# Patient Record
Sex: Female | Born: 1980 | Race: Asian | Hispanic: No | Marital: Married | Smoking: Never smoker
Health system: Southern US, Community
[De-identification: ages and names within clinical notes are randomized; demographics above are authoritative.]

## PROBLEM LIST (undated history)

## (undated) DIAGNOSIS — O169 Unspecified maternal hypertension, unspecified trimester: Secondary | ICD-10-CM

## (undated) DIAGNOSIS — E119 Type 2 diabetes mellitus without complications: Secondary | ICD-10-CM

## (undated) DIAGNOSIS — E039 Hypothyroidism, unspecified: Secondary | ICD-10-CM

## (undated) HISTORY — DX: Type 2 diabetes mellitus without complications: E11.9

---

## 2010-05-24 DIAGNOSIS — E119 Type 2 diabetes mellitus without complications: Secondary | ICD-10-CM

## 2010-05-24 HISTORY — DX: Type 2 diabetes mellitus without complications: E11.9

## 2012-11-17 ENCOUNTER — Ambulatory Visit (INDEPENDENT_AMBULATORY_CARE_PROVIDER_SITE_OTHER): Payer: 59 | Admitting: Gynecology

## 2012-11-17 ENCOUNTER — Encounter: Payer: Self-pay | Admitting: Gynecology

## 2012-11-17 VITALS — BP 100/60 | HR 78 | Resp 16 | Ht 62.0 in | Wt 169.0 lb

## 2012-11-17 DIAGNOSIS — N912 Amenorrhea, unspecified: Secondary | ICD-10-CM

## 2012-11-17 DIAGNOSIS — E119 Type 2 diabetes mellitus without complications: Secondary | ICD-10-CM

## 2012-11-17 LAB — HEMOGLOBIN A1C: Mean Plasma Glucose: 131 mg/dL — ABNORMAL HIGH (ref <117)

## 2012-11-17 MED ORDER — PROVIDA OB 20-20-1.25 MG PO CAPS: 1.0000 | ORAL_CAPSULE | Freq: Every day | ORAL | Status: DC

## 2012-11-17 MED ORDER — CONCEPT OB 130-92.4-1 MG PO CAPS: 1.0000 | ORAL_CAPSULE | Freq: Every day | ORAL | Status: DC

## 2012-11-17 MED ORDER — CONCEPT DHA 53.5-38-1 MG PO CAPS: 1.0000 | ORAL_CAPSULE | Freq: Every day | ORAL | Status: DC

## 2012-11-17 NOTE — Progress Notes (Signed)
Subjective:     Patient ID: Whitney Holloway, female   DOB: 1981/02/26, 32 y.o.   MRN: [id number]  HPI Comments: Pt here with husband to establish care, they recently moved from [place], menses due 5/5 but pt reports delay, thought might be related to stress of move.  Pt reports that she did not take pregnancy test.  Pt states menses are regular, they have been using condoms only for contraception.  Pt and husband informed re +UPT and are very happy. Pt denies any bleeding, cramping, only minimal nausea,no vomiting. She reports history of diabetes, had been on metformin fir 1.5y and recently changed to insulin.  Pt states FBS are 110+ and postprandial are 120's, she is on 70/30 and has been adjusting her dose recently.    Review of Systems  Constitutional: Negative for fever, activity change and appetite change.  Gastrointestinal: Negative for nausea, vomiting and diarrhea.  Genitourinary: Negative for vaginal bleeding, vaginal discharge, vaginal pain and pelvic pain.       Objective:   Physical Exam  Constitutional: She is oriented to person, place, and time. She appears well-developed and well-nourished.  Cardiovascular: Normal rate, regular rhythm and normal heart sounds.   Abdominal: Soft. She exhibits no distension and no mass. There is no tenderness. There is no rebound and no guarding.  Neurological: She is alert and oriented to person, place, and time.  Skin: Skin is warm and dry.  Pelvic exam:  VULVA: normal appearing vulva with no masses, tenderness or lesions,  VAGINA: normal appearing vagina with normal color and discharge, no lesions, CERVIX: normal appearing cervix without discharge or lesions, UTERUS: slightly enlarged, approx 10w, nontender  ADNEXA: normal adnexa in size, nontender and no masses.      Assessment:     Pregnancy diabetes     Plan:     Discussed importance of glycemic control in pregnancy Reviewed diet, activities PNV samples given Referral to OB ASAP  based on A2DM and gestational age, names of local practices given Pt has insulin, no rx needed at this time, suggest she chack FBS and PC's for new OB to adjust doses Questions addressed

## 2012-11-17 NOTE — Patient Instructions (Addendum)
Take a prenatal vitamen daily 1/2 unisom with vit B6 twice a day for nausea Avoid high order fish: tuna, mahi, sword, tile, grouper Keep blood sugar in control under control-fasting 70-90, 2h after meal less than 120 Exercise- heart rate below 140, no exercise flat on back

## 2012-11-19 ENCOUNTER — Encounter: Payer: Self-pay | Admitting: Gynecology

## 2012-11-20 ENCOUNTER — Telehealth: Payer: Self-pay | Admitting: *Deleted

## 2012-11-20 NOTE — Telephone Encounter (Signed)
Message copied by Lorraine Lax on Mon Nov 20, 2012  5:45 PM ------      Message from: Douglass Rivers      Created: Sat Nov 18, 2012  7:59 AM       Inform labs great, HBA1c a little high but not dangerous, she should try to get into OB soon due to her diabetes and gestational age ------

## 2012-11-20 NOTE — Telephone Encounter (Signed)
Tried to LM to call back no VM set up

## 2012-11-24 ENCOUNTER — Encounter: Payer: Self-pay | Admitting: Gynecology

## 2012-11-27 ENCOUNTER — Telehealth: Payer: Self-pay | Admitting: *Deleted

## 2012-11-27 NOTE — Telephone Encounter (Signed)
Erroneous Encounter

## 2012-11-27 NOTE — Telephone Encounter (Signed)
Pt notified (see labs)

## 2012-11-27 NOTE — Telephone Encounter (Signed)
Per Dr. Hyacinth Meeker needed to call [facility] to schedule pt for an appointment. I contacted [facility] and they said that the patient needs to call and schedule because they need to check her insurance benefits and make sure that it is financially okay with the patient. Gave them pt's insurance and told them that it was a Doctor Referral, they still need the patient to contact. I called the patient and notified her of this she said she would call them and set up a appointment and would call us with the date and time.  If patient does not call we will f/u.

## 2012-12-06 ENCOUNTER — Telehealth: Payer: Self-pay | Admitting: *Deleted

## 2012-12-06 NOTE — Telephone Encounter (Signed)
Called patient to f/u with her getting in with a OBGYN given her history of Diabetes. Patient said she is scheduled for a appointment with [facility] 12/19/12.

## 2012-12-19 LAB — OB RESULTS CONSOLE HGB/HCT, BLOOD: Hemoglobin: 12.5 g/dL

## 2012-12-19 LAB — OB RESULTS CONSOLE RUBELLA ANTIBODY, IGM: Rubella: IMMUNE

## 2012-12-19 LAB — OB RESULTS CONSOLE RPR: RPR: NONREACTIVE

## 2012-12-19 LAB — OB RESULTS CONSOLE HIV ANTIBODY (ROUTINE TESTING): HIV: NONREACTIVE

## 2012-12-19 LAB — OB RESULTS CONSOLE ANTIBODY SCREEN: Antibody Screen: NEGATIVE

## 2012-12-19 LAB — OB RESULTS CONSOLE HEPATITIS B SURFACE ANTIGEN: Hepatitis B Surface Ag: NEGATIVE

## 2012-12-20 ENCOUNTER — Encounter: Payer: Self-pay | Admitting: Gynecology

## 2012-12-20 ENCOUNTER — Other Ambulatory Visit: Payer: Self-pay | Admitting: Gynecology

## 2012-12-22 ENCOUNTER — Telehealth: Payer: Self-pay | Admitting: *Deleted

## 2012-12-22 MED ORDER — CONCEPT DHA 53.5-38-1 MG PO CAPS: 1.0000 | ORAL_CAPSULE | Freq: Every day | ORAL | Status: DC

## 2012-12-22 MED ORDER — CONCEPT OB 130-92.4-1 MG PO CAPS: 1.0000 | ORAL_CAPSULE | Freq: Every day | ORAL | Status: DC

## 2012-12-22 NOTE — Telephone Encounter (Signed)
Left Message To Call Back Re: Mychart request for OB Prenatal

## 2012-12-22 NOTE — Telephone Encounter (Signed)
S/w patient she had a appointment with [facility] on 12/19/12 pt says she saw a midwife, she doesn't see the actually physician until 12/27/12. She has taken all the samples Dr. Farrel Gobble has given her. Just needs some to last her until her appointment. Told patient we would refill them once until she see physician then she should get them filled by them. Pt is aware and agreeable.  Concept OB #30/0rf's Concept DHA #30/0rf's - Sent to [facility] (Pt is aware.)

## 2012-12-27 ENCOUNTER — Other Ambulatory Visit: Payer: Self-pay

## 2012-12-27 LAB — OB RESULTS CONSOLE GC/CHLAMYDIA
CHLAMYDIA, DNA PROBE: NEGATIVE
GC PROBE AMP, GENITAL: NEGATIVE

## 2012-12-28 ENCOUNTER — Other Ambulatory Visit (HOSPITAL_COMMUNITY): Payer: Self-pay | Admitting: Obstetrics & Gynecology

## 2012-12-28 DIAGNOSIS — O24119 Pre-existing diabetes mellitus, type 2, in pregnancy, unspecified trimester: Secondary | ICD-10-CM

## 2013-01-16 ENCOUNTER — Ambulatory Visit (HOSPITAL_COMMUNITY)
Admission: RE | Admit: 2013-01-16 | Discharge: 2013-01-16 | Disposition: A | Discharge status: Home / Self Care | Payer: 59 | Source: Ambulatory Visit | Attending: Obstetrics & Gynecology | Admitting: Obstetrics & Gynecology

## 2013-01-16 ENCOUNTER — Encounter (HOSPITAL_COMMUNITY): Payer: Self-pay

## 2013-01-16 DIAGNOSIS — E039 Hypothyroidism, unspecified: Secondary | ICD-10-CM | POA: Insufficient documentation

## 2013-01-16 DIAGNOSIS — O24119 Pre-existing diabetes mellitus, type 2, in pregnancy, unspecified trimester: Secondary | ICD-10-CM

## 2013-01-16 DIAGNOSIS — E119 Type 2 diabetes mellitus without complications: Secondary | ICD-10-CM

## 2013-01-16 DIAGNOSIS — E079 Disorder of thyroid, unspecified: Secondary | ICD-10-CM | POA: Insufficient documentation

## 2013-01-16 DIAGNOSIS — O358XX0 Maternal care for other (suspected) fetal abnormality and damage, not applicable or unspecified: Secondary | ICD-10-CM | POA: Insufficient documentation

## 2013-01-16 DIAGNOSIS — O24919 Unspecified diabetes mellitus in pregnancy, unspecified trimester: Secondary | ICD-10-CM | POA: Insufficient documentation

## 2013-01-16 NOTE — Progress Notes (Signed)
MATERNAL FETAL MEDICINE CONSULT  Patient Name: Whitney Holloway Medical Record Number:  [id number] Date of Birth: 09/27/1980 Requesting Physician Name:  Genia Del, MD Date of Service: 01/16/2013  Chief Complaint Insulin dependent diabetes in pregnancy  History of Present Illness Whitney Holloway was seen today secondary to insulin dependent diabetes in pregnancy at the request of Genia Del, MD.  The patient is a 32 y.o. G1P0000,at [redacted]w[redacted]d with an EDD of 06/28/2013, by Ultrasound dating method.  She came to the [place] approximately 3 months ago.  She was diagnosed with type II diabetes mellitus approximately 2 years ago and was managed with metformin until 6 months ago when she began insulin therapy.  She is currently taking 25 units of 70/30 insulin in the morning and 28 units in the evening.  Whitney Holloway did not bring her glucose log to her appointment today but reports that her fastings are below 95 and her 2 hour postprandial sugars are between 115-124.  She also has a history of hypothyroidism for which she takes thyroxine 30 mg daily.  She has had no other issues with this pregnancy.  She has an appointment with Endocrinology at the end of September to establish care.  She has had extensive diabetes education in [place].  Review of Systems Pertinent items are noted in HPI.  Patient History OB History  Gravida Para Term Preterm AB SAB TAB Ectopic Multiple Living  1 0 0 0 0 0 0 0 0 0     # Outcome Date GA Lbr Len/2nd Weight Sex Delivery Anes PTL Lv  1 CUR               Past Medical History  Diagnosis Date  . Diabetes 2012    insulin 65m-2014    No past surgical history on file.  History   Social History  . Marital Status: Married    Spouse Name: N/A    Number of Children: N/A  . Years of Education: N/A   Social History Main Topics  . Smoking status: Never Smoker   . Smokeless tobacco: Not on file  . Alcohol Use: No  . Drug Use: No  . Sexual Activity: Yes    Partners:  Male   Other Topics Concern  . Not on file   Social History Narrative  . No narrative on file    Family History  Problem Relation Age of Onset  . Diabetes Paternal Grandfather    Physical Examination Vitals - BP 134/88, pulse 106, Weight 173 lbs. General appearance - alert, well appearing, and in no distress  Assessment and Recommendations 1.  Diabetes mellitus in pregnancy.  Whitney Holloway has already met with a dietician and/or diabetes educator prior to arriving in the [place] to discuss dietary modification, blood glucose testing, and goals of diet therapy.  I reiterated the goals of therapy include fasting blood sugars less than 90 mg/dl and 2-hour postprandial less than 120 mg/dl with avoidance of hypoglycemia.  Although I do not have her glucose log to review today, from her report Whitney Holloway appears to be euglycemic on her current dose of 70/30.  She will likely need increasing dose of insulin to maintain euglycemia as the pregnancy progresses.  Ultimately, management of her insulin regimen will fall to her Endocrinologist, but if you feel dosage adjustments are required prior to her first visit there, we are happy to see her back to assist with medication adjustments.  Whitney Holloway reports she had an normal eye exam several months ago.  She should also have a baseline 24 hour urine collection to assess for occult renal disease.  Whitney Holloway will return in 2 weeks to complete her fetal anatomic survey.  Thereafter she will require serial growth ultrasounds every 4 weeks and twice weekly antepartum testing beginning at 32 weeks. 2.  Hypothyroidism.  Review of her outside records shows a recent TSH of 3.46, a Free T4 of 1.22, and a free T3 of 3.2 all of which are within normal limits.  Thus, I would continue her current thyroxine dose of 50 mg po daily.  I recommend check thyroid functions tests every trimester as increasing doses of thyroid replacement are usually required as pregnancy progresses.  No  additional ultrasounds or fetal surveillance is required above that already described above due to her diabetes.  Thank you for sending Whitney Holloway for this consult.  Please feel free to contact us if you have any questions.  I spent 30 minutes with Whitney Holloway today of which 50% was face-to-face counseling.  Rema Fendt, MD

## 2013-01-26 ENCOUNTER — Other Ambulatory Visit (HOSPITAL_COMMUNITY): Payer: Self-pay | Admitting: Obstetrics & Gynecology

## 2013-01-26 DIAGNOSIS — E119 Type 2 diabetes mellitus without complications: Secondary | ICD-10-CM

## 2013-01-26 DIAGNOSIS — O24919 Unspecified diabetes mellitus in pregnancy, unspecified trimester: Secondary | ICD-10-CM

## 2013-01-30 ENCOUNTER — Ambulatory Visit (HOSPITAL_COMMUNITY): Payer: 59

## 2013-01-30 ENCOUNTER — Ambulatory Visit (HOSPITAL_COMMUNITY)
Admission: RE | Admit: 2013-01-30 | Discharge: 2013-01-30 | Disposition: A | Discharge status: Home / Self Care | Payer: 59 | Source: Ambulatory Visit | Attending: Obstetrics & Gynecology | Admitting: Obstetrics & Gynecology

## 2013-01-30 DIAGNOSIS — Z3689 Encounter for other specified antenatal screening: Secondary | ICD-10-CM | POA: Insufficient documentation

## 2013-01-30 DIAGNOSIS — E079 Disorder of thyroid, unspecified: Secondary | ICD-10-CM | POA: Insufficient documentation

## 2013-01-30 DIAGNOSIS — O24919 Unspecified diabetes mellitus in pregnancy, unspecified trimester: Secondary | ICD-10-CM | POA: Insufficient documentation

## 2013-01-30 DIAGNOSIS — E119 Type 2 diabetes mellitus without complications: Secondary | ICD-10-CM

## 2013-01-30 DIAGNOSIS — O3410 Maternal care for benign tumor of corpus uteri, unspecified trimester: Secondary | ICD-10-CM | POA: Insufficient documentation

## 2013-01-30 DIAGNOSIS — E039 Hypothyroidism, unspecified: Secondary | ICD-10-CM | POA: Insufficient documentation

## 2013-01-30 DIAGNOSIS — D259 Leiomyoma of uterus, unspecified: Secondary | ICD-10-CM | POA: Insufficient documentation

## 2013-03-15 ENCOUNTER — Other Ambulatory Visit (HOSPITAL_COMMUNITY): Payer: Self-pay | Admitting: Obstetrics & Gynecology

## 2013-03-15 DIAGNOSIS — E119 Type 2 diabetes mellitus without complications: Secondary | ICD-10-CM

## 2013-03-16 ENCOUNTER — Ambulatory Visit (HOSPITAL_COMMUNITY)
Admission: RE | Admit: 2013-03-16 | Discharge: 2013-03-16 | Disposition: A | Discharge status: Home / Self Care | Payer: 59 | Source: Ambulatory Visit | Attending: Obstetrics & Gynecology | Admitting: Obstetrics & Gynecology

## 2013-03-16 VITALS — BP 146/95 | HR 120 | Wt 181.0 lb

## 2013-03-16 DIAGNOSIS — E079 Disorder of thyroid, unspecified: Secondary | ICD-10-CM | POA: Insufficient documentation

## 2013-03-16 DIAGNOSIS — Z3689 Encounter for other specified antenatal screening: Secondary | ICD-10-CM | POA: Insufficient documentation

## 2013-03-16 DIAGNOSIS — E119 Type 2 diabetes mellitus without complications: Secondary | ICD-10-CM

## 2013-03-16 DIAGNOSIS — O24919 Unspecified diabetes mellitus in pregnancy, unspecified trimester: Secondary | ICD-10-CM | POA: Insufficient documentation

## 2013-03-16 DIAGNOSIS — E039 Hypothyroidism, unspecified: Secondary | ICD-10-CM | POA: Insufficient documentation

## 2013-03-16 DIAGNOSIS — O3410 Maternal care for benign tumor of corpus uteri, unspecified trimester: Secondary | ICD-10-CM | POA: Insufficient documentation

## 2013-03-16 NOTE — Progress Notes (Signed)
Whitney Holloway  was seen today for an ultrasound appointment.  See full report in [facility].  Impression: Single IUP at 25 1/7 weeks Follow up due to pregestational diabetes, hypothyroidism Interval growth is appropriate (54th %tile) Normal interval anatomy Left uterine myoma as noted above.  Patient scheduled for fetal echo today  Recommendations: Recommend follow-up ultrasound examination in 4 weeks for interval growth.   Antepartum fetal testing to begin at 32 weeks.  Alpha Gula, MD

## 2013-03-29 ENCOUNTER — Other Ambulatory Visit: Payer: Self-pay

## 2013-04-16 LAB — OB RESULTS CONSOLE HGB/HCT, BLOOD: HEMATOCRIT: 38 %

## 2013-04-27 ENCOUNTER — Ambulatory Visit (HOSPITAL_COMMUNITY): Payer: 59

## 2013-05-30 ENCOUNTER — Encounter: Payer: Self-pay | Admitting: Gynecology

## 2013-06-10 ENCOUNTER — Inpatient Hospital Stay (HOSPITAL_COMMUNITY)
Admission: AD | Admit: 2013-06-10 | Discharge: 2013-06-15 | DRG: 765 | Disposition: A | Discharge status: Home / Self Care | Payer: 59 | Source: Ambulatory Visit | Attending: Obstetrics & Gynecology | Admitting: Obstetrics & Gynecology

## 2013-06-10 ENCOUNTER — Observation Stay (HOSPITAL_COMMUNITY): Payer: 59

## 2013-06-10 ENCOUNTER — Encounter (HOSPITAL_COMMUNITY): Payer: Self-pay | Admitting: *Deleted

## 2013-06-10 DIAGNOSIS — O9989 Other specified diseases and conditions complicating pregnancy, childbirth and the puerperium: Secondary | ICD-10-CM

## 2013-06-10 DIAGNOSIS — E119 Type 2 diabetes mellitus without complications: Secondary | ICD-10-CM | POA: Diagnosis present

## 2013-06-10 DIAGNOSIS — O139 Gestational [pregnancy-induced] hypertension without significant proteinuria, unspecified trimester: Secondary | ICD-10-CM | POA: Diagnosis present

## 2013-06-10 DIAGNOSIS — Z794 Long term (current) use of insulin: Secondary | ICD-10-CM

## 2013-06-10 DIAGNOSIS — Z22330 Carrier of Group B streptococcus: Secondary | ICD-10-CM

## 2013-06-10 DIAGNOSIS — O99892 Other specified diseases and conditions complicating childbirth: Secondary | ICD-10-CM | POA: Diagnosis present

## 2013-06-10 DIAGNOSIS — O368190 Decreased fetal movements, unspecified trimester, not applicable or unspecified: Secondary | ICD-10-CM | POA: Diagnosis present

## 2013-06-10 DIAGNOSIS — O409XX0 Polyhydramnios, unspecified trimester, not applicable or unspecified: Secondary | ICD-10-CM | POA: Diagnosis present

## 2013-06-10 DIAGNOSIS — E039 Hypothyroidism, unspecified: Secondary | ICD-10-CM | POA: Diagnosis present

## 2013-06-10 DIAGNOSIS — O169 Unspecified maternal hypertension, unspecified trimester: Secondary | ICD-10-CM

## 2013-06-10 DIAGNOSIS — O163 Unspecified maternal hypertension, third trimester: Secondary | ICD-10-CM

## 2013-06-10 DIAGNOSIS — O2432 Unspecified pre-existing diabetes mellitus in childbirth: Secondary | ICD-10-CM | POA: Diagnosis present

## 2013-06-10 DIAGNOSIS — Z9889 Other specified postprocedural states: Secondary | ICD-10-CM

## 2013-06-10 DIAGNOSIS — O99284 Endocrine, nutritional and metabolic diseases complicating childbirth: Secondary | ICD-10-CM

## 2013-06-10 DIAGNOSIS — O3660X0 Maternal care for excessive fetal growth, unspecified trimester, not applicable or unspecified: Secondary | ICD-10-CM | POA: Diagnosis present

## 2013-06-10 DIAGNOSIS — O321XX0 Maternal care for breech presentation, not applicable or unspecified: Principal | ICD-10-CM | POA: Diagnosis present

## 2013-06-10 DIAGNOSIS — O1400 Mild to moderate pre-eclampsia, unspecified trimester: Secondary | ICD-10-CM | POA: Diagnosis present

## 2013-06-10 DIAGNOSIS — E079 Disorder of thyroid, unspecified: Secondary | ICD-10-CM | POA: Diagnosis present

## 2013-06-10 HISTORY — DX: Hypothyroidism, unspecified: E03.9

## 2013-06-10 HISTORY — DX: Unspecified maternal hypertension, unspecified trimester: O16.9

## 2013-06-10 LAB — COMPREHENSIVE METABOLIC PANEL
ALK PHOS: 298 U/L — AB (ref 39–117)
ALT: 281 U/L — AB (ref 0–35)
ALT: 266 U/L — ABNORMAL HIGH (ref 0–35)
AST: 202 U/L — ABNORMAL HIGH (ref 0–37)
AST: 181 U/L — ABNORMAL HIGH (ref 0–37)
Albumin: 2.1 g/dL — ABNORMAL LOW (ref 3.5–5.2)
Albumin: 2 g/dL — ABNORMAL LOW (ref 3.5–5.2)
Alkaline Phosphatase: 294 U/L — ABNORMAL HIGH (ref 39–117)
BILIRUBIN TOTAL: 0.8 mg/dL (ref 0.3–1.2)
BUN: 13 mg/dL (ref 6–23)
BUN: 10 mg/dL (ref 6–23)
CO2: 21 mEq/L (ref 19–32)
CO2: 22 meq/L (ref 19–32)
CREATININE: 0.5 mg/dL (ref 0.50–1.10)
Calcium: 9.5 mg/dL (ref 8.4–10.5)
Calcium: 9.4 mg/dL (ref 8.4–10.5)
Chloride: 105 mEq/L (ref 96–112)
Chloride: 107 mEq/L (ref 96–112)
Creatinine, Ser: 0.56 mg/dL (ref 0.50–1.10)
GFR calc non Af Amer: >90 mL/min (ref >90)
GLUCOSE: 115 mg/dL — AB (ref 70–99)
Glucose, Bld: 63 mg/dL — ABNORMAL LOW (ref 70–99)
Potassium: 4.2 mEq/L (ref 3.7–5.3)
Potassium: 4.7 mEq/L (ref 3.7–5.3)
SODIUM: 139 meq/L (ref 137–147)
Sodium: 140 mEq/L (ref 137–147)
Total Bilirubin: 0.8 mg/dL (ref 0.3–1.2)
Total Protein: 6.2 g/dL (ref 6.0–8.3)
Total Protein: 6.1 g/dL (ref 6.0–8.3)

## 2013-06-10 LAB — GLUCOSE, CAPILLARY
GLUCOSE-CAPILLARY: 66 mg/dL — AB (ref 70–99)
GLUCOSE-CAPILLARY: 82 mg/dL (ref 70–99)
Glucose-Capillary: 82 mg/dL (ref 70–99)
Glucose-Capillary: 93 mg/dL (ref 70–99)

## 2013-06-10 LAB — PROTEIN / CREATININE RATIO, URINE
Creatinine, Urine: 63.49 mg/dL
Protein Creatinine Ratio: 0.57 — ABNORMAL HIGH (ref 0.00–0.15)
Total Protein, Urine: 36.2 mg/dL

## 2013-06-10 LAB — CBC
HEMATOCRIT: 35.7 % — AB (ref 36.0–46.0)
Hemoglobin: 11.9 g/dL — ABNORMAL LOW (ref 12.0–15.0)
MCH: 27 pg (ref 26.0–34.0)
MCHC: 33.3 g/dL (ref 30.0–36.0)
MCV: 81.1 fL (ref 78.0–100.0)
Platelets: 359 10*3/uL (ref 150–400)
RBC: 4.4 MIL/uL (ref 3.87–5.11)
RDW: 15.1 % (ref 11.5–15.5)
WBC: 7.7 10*3/uL (ref 4.0–10.5)

## 2013-06-10 LAB — HEPATITIS B SURFACE ANTIGEN: HEP B S AG: NEGATIVE

## 2013-06-10 LAB — HEPATITIS C ANTIBODY: HCV Ab: NEGATIVE

## 2013-06-10 LAB — URIC ACID: Uric Acid, Serum: 5 mg/dL (ref 2.4–7.0)

## 2013-06-10 MED ORDER — INSULIN NPH (HUMAN) (ISOPHANE) 100 UNIT/ML ~~LOC~~ SUSP
40.0000 [IU] | Freq: Every day | SUBCUTANEOUS | Status: DC
  Administered 2013-06-11: 40 [IU] via SUBCUTANEOUS

## 2013-06-10 MED ORDER — INSULIN NPH (HUMAN) (ISOPHANE) 100 UNIT/ML ~~LOC~~ SUSP
20.0000 [IU] | Freq: Once | SUBCUTANEOUS | Status: AC
  Administered 2013-06-10: 20 [IU] via SUBCUTANEOUS

## 2013-06-10 MED ORDER — INSULIN NPH (HUMAN) (ISOPHANE) 100 UNIT/ML ~~LOC~~ SUSP
42.0000 [IU] | Freq: Every day | SUBCUTANEOUS | Status: DC
  Administered 2013-06-10: 42 [IU] via SUBCUTANEOUS
  Filled 2013-06-10: qty 10

## 2013-06-10 MED ORDER — LEVOTHYROXINE SODIUM 100 MCG PO TABS
100.0000 ug | ORAL_TABLET | Freq: Every day | ORAL | Status: DC
  Administered 2013-06-10 – 2013-06-11 (×2): 100 ug via ORAL
  Filled 2013-06-10 (×3): qty 1

## 2013-06-10 NOTE — H&P (Signed)
  OB ADMISSION/ HISTORY & PHYSICAL:  Admission Date: 06/10/2013 12:33 AM  Admit Diagnosis: 37.[redacted] weeks gestation, Hypertension, Elevated liver enzymes  Whitney Holloway is a 33 y.o. female presenting for decreased fetal movement since 1000 am today. NST reactive. Hypertension noted after several BP's. Liver enzymes found to be elevated. Pt is admitted for observation and serial labs to rule out PEC. Denies HA, visual changes or epigastric pain. No nausea or vomiting. No CP or SOB. BS well controlled.  Prenatal History: G1P0000   EDC:06/28/2013, by Ultrasound   Prenatal care at [facility]  Primary Ob Provider: Dr. Dellis Filbert Prenatal course complicated by IDDM-preexisting, hypothyroidism, LGA, breech presentation, +GBS  Prenatal Labs: ABO, Rh: AB (06/27 1042) POS Antibody:  NEG Rubella:    RPR:   NR HBsAg:   NEG HIV:   NEG GBS:   POS 1 hr GTT : n/a  Medical / Surgical History :  Past medical history:  Past Medical History  Diagnosis Date  . Diabetes 2012    insulin 1m-2014  . Hypertension during pregnancy 06/10/2013     Past surgical history: History reviewed. No pertinent past surgical history.  Family History:  Family History  Problem Relation Age of Onset  . Diabetes Paternal Grandfather      Social History:  reports that she has never smoked. She does not have any smokeless tobacco history on file. She reports that she does not drink alcohol or use illicit drugs.  Allergies: Review of patient's allergies indicates no known allergies.   Current Medications at time of admission:  Prior to Admission medications   Medication Sig Start Date End Date Taking? Authorizing Provider  insulin NPH-regular (NOVOLIN 70/30) (70-30) 100 UNIT/ML injection Inject into the skin.   Yes Historical Provider, MD  levothyroxine (SYNTHROID, LEVOTHROID) 100 MCG tablet Take 100 mcg by mouth daily before breakfast.   Yes Historical Provider, MD  Prenat-FeFum-FePo-FA-Omega 3  (CONCEPT DHA) 53.5-38-1 MG CAPS Take 1 tablet by mouth daily. 12/22/12  Yes Azalia Bilis, MD  levothyroxine (SYNTHROID, LEVOTHROID) 75 MCG tablet Take 75 mcg by mouth daily before breakfast.    Historical Provider, MD  Prenat w/o A Vit-FeFum-FePo-FA (CONCEPT OB) 130-92.4-1 MG CAPS Take 1 tablet by mouth daily. 12/22/12   Azalia Bilis, MD  Prenat w/o A Vit-FeFum-FePo-FA (PROVIDA OB) 20-20-1.25 MG CAPS Take 1 tablet by mouth daily. 11/17/12   Azalia Bilis, MD     Review of Systems: Decreased FM No ctx No LOF No VB No HA, visual changes, or epigastric pain  Physical Exam:  VS: Blood pressure 129/84, pulse 83, temperature 98.1 F (36.7 C), resp. rate 20, height 5\' 2"  (1.575 m), weight 86.456 kg (190 lb 9.6 oz), last menstrual period 08/25/2012.  General: alert and oriented, appears comfortable\ HEENT : nCAT Neck: supple with FROM Heart: RRR Lungs: Clear lung fields No CVAT Abdomen: Gravid, soft and non-tender, non-distended / uterus: gravid, non-tender, No RUQ tenderness Extremities: no edema, DTR's 2+, no clonus, Homan's negative Neuro: non focal Skin: untact Genitalia / VE:  deferred  FHR: baseline rate 150 / variability mod / accelerations present / no decelerations TOCO: irregular, mild  Assessment: 37.[redacted] weeks gestation Decreased FM with Reactive NST Pre-gestational IDDM Hypothyroidism New onset mild HTN with elevated Transaminases of ? Etx. No s/s PEC  Plan:  Clear Liquids Admit for observation. Continuos EFM and toco Monitor serial BP's. Check urine protein creatinine ratio.  Repeat CMP in 8 hrs.

## 2013-06-10 NOTE — MAU Provider Note (Signed)
Agree with note H&P done

## 2013-06-10 NOTE — Progress Notes (Signed)
Patient ID: Bralynn Velador, female   DOB: June 17, 1980, 33 y.o.   MRN: [id number] HD#0  S: Pt denies HA, visual changes or epigastric pain. Good FM. No CP or SOB. No contractions or LOF.  O: Patient Vitals for the past 24 hrs:  BP Temp Temp src Pulse Resp Height Weight  06/10/13 0759 120/72 mmHg - - 72 - - -  06/10/13 0757 131/93 mmHg 97.7 F (36.5 C) Oral 89 20 - -  06/10/13 0327 136/90 mmHg 98.1 F (36.7 C) Oral 85 18 - -  06/10/13 0247 129/84 mmHg - - 83 - - -  06/10/13 0232 138/88 mmHg - - 83 - - -  06/10/13 0217 133/89 mmHg - - 86 - - -  06/10/13 0202 132/90 mmHg - - 86 - - -  06/10/13 0147 136/94 mmHg - - 82 - - -  06/10/13 0132 134/91 mmHg - - 84 - - -  06/10/13 0117 132/92 mmHg - - 91 - - -  06/10/13 0103 143/94 mmHg - - 90 - - -  06/10/13 0039 154/99 mmHg 98.1 F (36.7 C) - 95 20 5\' 2"  (1.575 m) 86.456 kg (190 lb 9.6 oz)   CBC    Component Value Date/Time   WBC 7.7 06/10/2013 0110   RBC 4.40 06/10/2013 0110   HGB 11.9* 06/10/2013 0110   HGB 12.5 12/19/2012   HCT 35.7* 06/10/2013 0110   HCT 38 04/16/2013   PLT 359 06/10/2013 0110   MCV 81.1 06/10/2013 0110   MCH 27.0 06/10/2013 0110   MCHC 33.3 06/10/2013 0110   RDW 15.1 06/10/2013 0110    CMP     Component Value Date/Time   NA 140 06/10/2013 0859   K 4.7 06/10/2013 0859   CL 107 06/10/2013 0859   CO2 22 06/10/2013 0859   GLUCOSE 63* 06/10/2013 0859   BUN 10 06/10/2013 0859   CREATININE 0.50 06/10/2013 0859   CALCIUM 9.4 06/10/2013 0859   PROT 6.1 06/10/2013 0859   ALBUMIN 2.0* 06/10/2013 0859   AST 181* 06/10/2013 0859   ALT 266* 06/10/2013 0859   ALKPHOS 294* 06/10/2013 0859   BILITOT 0.8 06/10/2013 0859   GFRNONAA >90 06/10/2013 0859   GFRAA >90 06/10/2013 0859    FHT 140-150, BTBV 5-25, 15 x15 accels, no decels. No contractions. Category 1 tracing BPP 6/8 Pilar Plate Breech presentation  A: [redacted]w[redacted]d IUP IDDM- pre-gestational, reportedly well controlled (Balan) H/O LGA, mild poly and Breech presentation Hypothyroidism New  onset HTN with elevated Urine Protein/Creatinine ratio(.57). Stable/slightly improved LFTs and   stable BP with mild range elevation noted.  P: Admit  NST q shift BS monitoring  Start 24 hr UTP Hepatitis B, Hepatitis C, CMV serology Serial PEC labs

## 2013-06-10 NOTE — MAU Note (Addendum)
Decreased FM today. Feel abd tightening.. Scheduled for c/s 1/29 due to breech.

## 2013-06-10 NOTE — MAU Provider Note (Signed)
History     CSN: [id number]  Arrival date and time: 06/10/13 5284   First Provider Initiated Contact with Patient 06/10/13 0225      Chief Complaint  Patient presents with  . Decreased Fetal Movement   HPI Comments: G1 @ 37.3 here for decreased FM since 1000 this am. Reports good hydration and nutrition. H/o Type II DM on Insulin and Hypothyroidism on Levothyroxine. Scheduled for primary CS on 06/21/13 d/t breech presentation and LGA. Has felt FM since arrival.   OB History   Grav Para Term Preterm Abortions TAB SAB Ect Mult Living   1 0 0 0 0 0 0 0 0 0       Past Medical History  Diagnosis Date  . Diabetes 2012    insulin 83m-2014    History reviewed. No pertinent past surgical history.  Family History  Problem Relation Age of Onset  . Diabetes Paternal Grandfather     History  Substance Use Topics  . Smoking status: Never Smoker   . Smokeless tobacco: Not on file  . Alcohol Use: No    Allergies: No Known Allergies  Prescriptions prior to admission  Medication Sig Dispense Refill  . insulin NPH-regular (NOVOLIN 70/30) (70-30) 100 UNIT/ML injection Inject into the skin.      Marland Kitchen levothyroxine (SYNTHROID, LEVOTHROID) 100 MCG tablet Take 100 mcg by mouth daily before breakfast.      . Prenat-FeFum-FePo-FA-Omega 3 (CONCEPT DHA) 53.5-38-1 MG CAPS Take 1 tablet by mouth daily.  30 capsule  0  . levothyroxine (SYNTHROID, LEVOTHROID) 75 MCG tablet Take 75 mcg by mouth daily before breakfast.      . Prenat w/o A Vit-FeFum-FePo-FA (CONCEPT OB) 130-92.4-1 MG CAPS Take 1 tablet by mouth daily.  30 capsule  0  . Prenat w/o A Vit-FeFum-FePo-FA (PROVIDA OB) 20-20-1.25 MG CAPS Take 1 tablet by mouth daily.  30 capsule  0    Review of Systems  Constitutional: Negative.   Eyes: Negative for blurred vision, double vision and photophobia.  Respiratory: Negative.   Cardiovascular: Negative.   Gastrointestinal: Negative for heartburn, nausea, vomiting and abdominal pain.   Genitourinary: Negative.   Musculoskeletal: Negative.   Skin: Negative.   Neurological: Negative.  Negative for headaches.  Endo/Heme/Allergies: Negative.   Psychiatric/Behavioral: Negative.    Physical Exam   Blood pressure 133/89, pulse 86, temperature 98.1 F (36.7 C), resp. rate 20, height 5\' 2"  (1.575 m), weight 86.456 kg (190 lb 9.6 oz), last menstrual period 08/25/2012.  Physical Exam  Constitutional: She is oriented to person, place, and time. She appears well-developed and well-nourished.  HENT:  Head: Normocephalic and atraumatic.  Eyes: Pupils are equal, round, and reactive to light.  Neck: Normal range of motion.  Cardiovascular: Normal rate and regular rhythm.   Respiratory: Effort normal and breath sounds normal.  GI: Soft. Bowel sounds are normal. There is no tenderness.  gravid  Genitourinary:  deferred  Musculoskeletal: Normal range of motion. She exhibits no edema.  Neurological: She is alert and oriented to person, place, and time. She has normal reflexes.  Skin: Skin is warm and dry.  Psychiatric: She has a normal mood and affect.  FHT: 150 bpm, mod variability, accels present, no decels Toco: irregular, mild  MAU Course  Procedures CBC- WNL CMP- elevate LE Uric acid- WNL NST- reactive  MDM n/a  Assessment and Plan  37.[redacted] weeks gestation Hypertension Elevate liver enzymes  Admit for observation. Serial BP's. Check urine protein creatinine ratio. Repeat labs in 8  hrs.  Consult with Dr. Ronita Hipps. Recommend plan as stated above.    Whitney Holloway, [place], N 06/10/2013, 2:33 AM

## 2013-06-11 ENCOUNTER — Encounter (HOSPITAL_COMMUNITY)
Admission: AD | Disposition: A | Discharge status: Home / Self Care | Payer: Self-pay | Source: Ambulatory Visit | Attending: Obstetrics & Gynecology

## 2013-06-11 ENCOUNTER — Encounter (HOSPITAL_COMMUNITY): Payer: 59 | Admitting: Anesthesiology

## 2013-06-11 ENCOUNTER — Encounter (HOSPITAL_COMMUNITY): Payer: Self-pay | Admitting: Anesthesiology

## 2013-06-11 ENCOUNTER — Inpatient Hospital Stay (HOSPITAL_COMMUNITY): Payer: 59 | Admitting: Anesthesiology

## 2013-06-11 DIAGNOSIS — O1400 Mild to moderate pre-eclampsia, unspecified trimester: Secondary | ICD-10-CM | POA: Diagnosis present

## 2013-06-11 DIAGNOSIS — Z9889 Other specified postprocedural states: Secondary | ICD-10-CM

## 2013-06-11 LAB — COMPREHENSIVE METABOLIC PANEL
ALT: 266 U/L — ABNORMAL HIGH (ref 0–35)
ALT: 318 U/L — ABNORMAL HIGH (ref 0–35)
AST: 212 U/L — AB (ref 0–37)
AST: 249 U/L — ABNORMAL HIGH (ref 0–37)
Albumin: 2.2 g/dL — ABNORMAL LOW (ref 3.5–5.2)
Albumin: 2.3 g/dL — ABNORMAL LOW (ref 3.5–5.2)
Alkaline Phosphatase: 286 U/L — ABNORMAL HIGH (ref 39–117)
Alkaline Phosphatase: 341 U/L — ABNORMAL HIGH (ref 39–117)
BILIRUBIN TOTAL: 0.9 mg/dL (ref 0.3–1.2)
BUN: 12 mg/dL (ref 6–23)
BUN: 14 mg/dL (ref 6–23)
CALCIUM: 9.7 mg/dL (ref 8.4–10.5)
CHLORIDE: 104 meq/L (ref 96–112)
CO2: 19 meq/L (ref 19–32)
CO2: 21 mEq/L (ref 19–32)
CREATININE: 0.63 mg/dL (ref 0.50–1.10)
Calcium: 9.6 mg/dL (ref 8.4–10.5)
Chloride: 105 mEq/L (ref 96–112)
Creatinine, Ser: 0.58 mg/dL (ref 0.50–1.10)
GFR calc Af Amer: >90 mL/min (ref >90)
GFR calc non Af Amer: >90 mL/min (ref >90)
GFR calc non Af Amer: >90 mL/min (ref >90)
GLUCOSE: 74 mg/dL (ref 70–99)
Glucose, Bld: 72 mg/dL (ref 70–99)
Potassium: 4.1 mEq/L (ref 3.7–5.3)
Potassium: 5.1 mEq/L (ref 3.7–5.3)
Sodium: 138 mEq/L (ref 137–147)
Sodium: 139 mEq/L (ref 137–147)
Total Bilirubin: 1.1 mg/dL (ref 0.3–1.2)
Total Protein: 6.6 g/dL (ref 6.0–8.3)
Total Protein: 7 g/dL (ref 6.0–8.3)

## 2013-06-11 LAB — GLUCOSE, CAPILLARY
GLUCOSE-CAPILLARY: 55 mg/dL — AB (ref 70–99)
GLUCOSE-CAPILLARY: 36 mg/dL — AB (ref 70–99)
GLUCOSE-CAPILLARY: 56 mg/dL — AB (ref 70–99)
Glucose-Capillary: 71 mg/dL (ref 70–99)
Glucose-Capillary: 89 mg/dL (ref 70–99)
Glucose-Capillary: 56 mg/dL — ABNORMAL LOW (ref 70–99)
Glucose-Capillary: 64 mg/dL — ABNORMAL LOW (ref 70–99)
Glucose-Capillary: 57 mg/dL — ABNORMAL LOW (ref 70–99)
Glucose-Capillary: 42 mg/dL — CL (ref 70–99)

## 2013-06-11 LAB — CBC
HCT: 37 % (ref 36.0–46.0)
HEMATOCRIT: 38.5 % (ref 36.0–46.0)
Hemoglobin: 12 g/dL (ref 12.0–15.0)
Hemoglobin: 12.7 g/dL (ref 12.0–15.0)
MCH: 26.4 pg (ref 26.0–34.0)
MCH: 26.9 pg (ref 26.0–34.0)
MCHC: 32.4 g/dL (ref 30.0–36.0)
MCHC: 33 g/dL (ref 30.0–36.0)
MCV: 81.5 fL (ref 78.0–100.0)
MCV: 81.6 fL (ref 78.0–100.0)
PLATELETS: 352 10*3/uL (ref 150–400)
PLATELETS: 404 10*3/uL — AB (ref 150–400)
RBC: 4.54 MIL/uL (ref 3.87–5.11)
RBC: 4.72 MIL/uL (ref 3.87–5.11)
RDW: 15.4 % (ref 11.5–15.5)
RDW: 15.4 % (ref 11.5–15.5)
WBC: 6.9 10*3/uL (ref 4.0–10.5)
WBC: 8 10*3/uL (ref 4.0–10.5)

## 2013-06-11 LAB — PROTEIN, URINE, 24 HOUR
Collection Interval-UPROT: 24 hours
Protein, 24H Urine: 264 mg/d — ABNORMAL HIGH (ref 50–100)
Protein, Urine: 17 mg/dL
URINE TOTAL VOLUME-UPROT: 1550 mL

## 2013-06-11 LAB — TYPE AND SCREEN
ABO/RH(D): AB POS
ANTIBODY SCREEN: NEGATIVE

## 2013-06-11 LAB — CREATININE CLEARANCE, URINE, 24 HOUR
CREAT CLEAR: 85 mL/min (ref 75–115)
CREATININE, URINE: 49.74 mg/dL
CREATININE: 0.63 mg/dL (ref 0.50–1.10)
Collection Interval-CRCL: 24 hours
Creatinine, 24H Ur: 771 mg/d (ref 700–1800)
Urine Total Volume-CRCL: 1550 mL

## 2013-06-11 LAB — ABO/RH: ABO/RH(D): AB POS

## 2013-06-11 LAB — HEPATITIS B E ANTIGEN: Hep B E Ag: NEGATIVE

## 2013-06-11 SURGERY — Surgical Case
Anesthesia: Spinal

## 2013-06-11 MED ORDER — ONDANSETRON HCL 4 MG/2ML IJ SOLN
INTRAMUSCULAR | Status: DC | PRN
  Administered 2013-06-11: 4 mg via INTRAVENOUS

## 2013-06-11 MED ORDER — DIBUCAINE 1 % RE OINT: 1.0000 "application " | TOPICAL_OINTMENT | RECTAL | Status: DC | PRN

## 2013-06-11 MED ORDER — MEPERIDINE HCL 25 MG/ML IJ SOLN: 6.2500 mg | INTRAMUSCULAR | Status: DC | PRN

## 2013-06-11 MED ORDER — DIPHENHYDRAMINE HCL 50 MG/ML IJ SOLN
INTRAMUSCULAR | Status: AC
  Filled 2013-06-11: qty 1

## 2013-06-11 MED ORDER — LACTATED RINGERS IV SOLN
INTRAVENOUS | Status: DC | PRN
  Administered 2013-06-11 (×3): via INTRAVENOUS

## 2013-06-11 MED ORDER — NALBUPHINE HCL 10 MG/ML IJ SOLN
5.0000 mg | INTRAMUSCULAR | Status: DC | PRN
  Administered 2013-06-11 (×2): 5 mg via INTRAVENOUS
  Filled 2013-06-11: qty 1

## 2013-06-11 MED ORDER — ONDANSETRON HCL 4 MG/2ML IJ SOLN: 4.0000 mg | Freq: Three times a day (TID) | INTRAMUSCULAR | Status: DC | PRN

## 2013-06-11 MED ORDER — ONDANSETRON HCL 4 MG/2ML IJ SOLN: 4.0000 mg | INTRAMUSCULAR | Status: DC | PRN

## 2013-06-11 MED ORDER — BUPIVACAINE HCL (PF) 0.25 % IJ SOLN
INTRAMUSCULAR | Status: AC
  Filled 2013-06-11: qty 30

## 2013-06-11 MED ORDER — FENTANYL CITRATE 0.05 MG/ML IJ SOLN
INTRAMUSCULAR | Status: DC | PRN
  Administered 2013-06-11: 25 ug via INTRATHECAL

## 2013-06-11 MED ORDER — IBUPROFEN 600 MG PO TABS
600.0000 mg | ORAL_TABLET | Freq: Four times a day (QID) | ORAL | Status: DC
  Administered 2013-06-12 – 2013-06-15 (×14): 600 mg via ORAL
  Filled 2013-06-11 (×14): qty 1

## 2013-06-11 MED ORDER — FENTANYL CITRATE 0.05 MG/ML IJ SOLN
INTRAMUSCULAR | Status: AC
  Filled 2013-06-11: qty 2

## 2013-06-11 MED ORDER — MAGNESIUM SULFATE 40 G IN LACTATED RINGERS - SIMPLE
2.0000 g/h | INTRAVENOUS | Status: DC
  Administered 2013-06-11 – 2013-06-12 (×2): 2 g/h via INTRAVENOUS
  Filled 2013-06-11 (×2): qty 500

## 2013-06-11 MED ORDER — MORPHINE SULFATE (PF) 0.5 MG/ML IJ SOLN
INTRAMUSCULAR | Status: DC | PRN
  Administered 2013-06-11: .15 mg via INTRATHECAL

## 2013-06-11 MED ORDER — DEXTROSE IN LACTATED RINGERS 5 % IV SOLN
INTRAVENOUS | Status: DC
  Administered 2013-06-12 (×2): via INTRAVENOUS

## 2013-06-11 MED ORDER — PHENYLEPHRINE 8 MG IN D5W 100 ML (0.08MG/ML) PREMIX OPTIME
INJECTION | INTRAVENOUS | Status: AC
  Filled 2013-06-11: qty 100

## 2013-06-11 MED ORDER — FENTANYL CITRATE 0.05 MG/ML IJ SOLN
INTRAMUSCULAR | Status: DC | PRN
  Administered 2013-06-11: 75 ug via INTRAVENOUS

## 2013-06-11 MED ORDER — FENTANYL CITRATE 0.05 MG/ML IJ SOLN: 25.0000 ug | INTRAMUSCULAR | Status: DC | PRN

## 2013-06-11 MED ORDER — SCOPOLAMINE 1 MG/3DAYS TD PT72
1.0000 | MEDICATED_PATCH | Freq: Once | TRANSDERMAL | Status: AC
  Administered 2013-06-11: 1.5 mg via TRANSDERMAL

## 2013-06-11 MED ORDER — DIPHENHYDRAMINE HCL 25 MG PO CAPS
25.0000 mg | ORAL_CAPSULE | ORAL | Status: DC | PRN
  Filled 2013-06-11: qty 1

## 2013-06-11 MED ORDER — LACTATED RINGERS IV SOLN
INTRAVENOUS | Status: DC
  Administered 2013-06-11: 12:00:00 via INTRAVENOUS

## 2013-06-11 MED ORDER — PRENATAL MULTIVITAMIN CH
1.0000 | ORAL_TABLET | Freq: Every day | ORAL | Status: DC
  Administered 2013-06-12 – 2013-06-15 (×4): 1 via ORAL
  Filled 2013-06-11 (×4): qty 1

## 2013-06-11 MED ORDER — METOCLOPRAMIDE HCL 5 MG/ML IJ SOLN: 10.0000 mg | Freq: Three times a day (TID) | INTRAMUSCULAR | Status: DC | PRN

## 2013-06-11 MED ORDER — ONDANSETRON HCL 4 MG/2ML IJ SOLN
INTRAMUSCULAR | Status: AC
  Filled 2013-06-11: qty 2

## 2013-06-11 MED ORDER — WITCH HAZEL-GLYCERIN EX PADS: 1.0000 "application " | MEDICATED_PAD | CUTANEOUS | Status: DC | PRN

## 2013-06-11 MED ORDER — DEXTROSE IN LACTATED RINGERS 5 % IV SOLN
INTRAVENOUS | Status: DC
  Administered 2013-06-11: 15:00:00 via INTRAVENOUS
  Administered 2013-06-11: 1000 mL via INTRAVENOUS

## 2013-06-11 MED ORDER — INSULIN NPH (HUMAN) (ISOPHANE) 100 UNIT/ML ~~LOC~~ SUSP: 20.0000 [IU] | Freq: Two times a day (BID) | SUBCUTANEOUS | Status: DC

## 2013-06-11 MED ORDER — NALBUPHINE HCL 10 MG/ML IJ SOLN
INTRAMUSCULAR | Status: AC
  Filled 2013-06-11: qty 1

## 2013-06-11 MED ORDER — BUPIVACAINE IN DEXTROSE 0.75-8.25 % IT SOLN
INTRATHECAL | Status: DC | PRN
  Administered 2013-06-11: 1.4 mL via INTRATHECAL

## 2013-06-11 MED ORDER — DEXTROSE 50 % IV SOLN
INTRAVENOUS | Status: AC
Start: 2013-06-11 — End: 2013-06-12
  Filled 2013-06-11: qty 50

## 2013-06-11 MED ORDER — TETANUS-DIPHTH-ACELL PERTUSSIS 5-2.5-18.5 LF-MCG/0.5 IM SUSP
0.5000 mL | Freq: Once | INTRAMUSCULAR | Status: DC
  Filled 2013-06-11: qty 0.5

## 2013-06-11 MED ORDER — NALOXONE HCL 0.4 MG/ML IJ SOLN: 0.4000 mg | INTRAMUSCULAR | Status: DC | PRN

## 2013-06-11 MED ORDER — MAGNESIUM SULFATE BOLUS VIA INFUSION
4.0000 g | Freq: Once | INTRAVENOUS | Status: AC
  Administered 2013-06-11: 4 g via INTRAVENOUS
  Filled 2013-06-11: qty 500

## 2013-06-11 MED ORDER — SIMETHICONE 80 MG PO CHEW
80.0000 mg | CHEWABLE_TABLET | ORAL | Status: DC
Start: 2013-06-12 — End: 2013-06-15
  Administered 2013-06-11 – 2013-06-14 (×4): 80 mg via ORAL
  Filled 2013-06-11 (×4): qty 1

## 2013-06-11 MED ORDER — MORPHINE SULFATE (PF) 0.5 MG/ML IJ SOLN
INTRAMUSCULAR | Status: DC | PRN
  Administered 2013-06-11: 4.85 ug via INTRAVENOUS

## 2013-06-11 MED ORDER — SIMETHICONE 80 MG PO CHEW
80.0000 mg | CHEWABLE_TABLET | Freq: Three times a day (TID) | ORAL | Status: DC
  Administered 2013-06-12 – 2013-06-15 (×10): 80 mg via ORAL
  Filled 2013-06-11 (×10): qty 1

## 2013-06-11 MED ORDER — CITRIC ACID-SODIUM CITRATE 334-500 MG/5ML PO SOLN
ORAL | Status: AC
  Administered 2013-06-11: 30 mL
  Filled 2013-06-11: qty 15

## 2013-06-11 MED ORDER — MAGNESIUM HYDROXIDE 400 MG/5ML PO SUSP
30.0000 mL | ORAL | Status: DC | PRN
Start: 2013-06-11 — End: 2013-06-15
  Filled 2013-06-11: qty 30

## 2013-06-11 MED ORDER — NALOXONE HCL 1 MG/ML IJ SOLN
1.0000 ug/kg/h | INTRAMUSCULAR | Status: DC | PRN
  Filled 2013-06-11: qty 2

## 2013-06-11 MED ORDER — DIPHENHYDRAMINE HCL 25 MG PO CAPS: 25.0000 mg | ORAL_CAPSULE | Freq: Four times a day (QID) | ORAL | Status: DC | PRN

## 2013-06-11 MED ORDER — OXYTOCIN 40 UNITS IN LACTATED RINGERS INFUSION - SIMPLE MED
INTRAVENOUS | Status: DC | PRN
Start: 2013-06-11 — End: 2013-06-11
  Administered 2013-06-11: 40 [IU] via INTRAVENOUS

## 2013-06-11 MED ORDER — CEFAZOLIN SODIUM-DEXTROSE 2-3 GM-% IV SOLR
INTRAVENOUS | Status: AC
  Filled 2013-06-11: qty 50

## 2013-06-11 MED ORDER — PHENYLEPHRINE 40 MCG/ML (10ML) SYRINGE FOR IV PUSH (FOR BLOOD PRESSURE SUPPORT)
PREFILLED_SYRINGE | INTRAVENOUS | Status: AC
  Filled 2013-06-11: qty 5

## 2013-06-11 MED ORDER — SIMETHICONE 80 MG PO CHEW: 80.0000 mg | CHEWABLE_TABLET | ORAL | Status: DC | PRN

## 2013-06-11 MED ORDER — PHENYLEPHRINE 8 MG IN D5W 100 ML (0.08MG/ML) PREMIX OPTIME
INJECTION | INTRAVENOUS | Status: DC | PRN
  Administered 2013-06-11: 30 ug/min via INTRAVENOUS

## 2013-06-11 MED ORDER — DIPHENHYDRAMINE HCL 50 MG/ML IJ SOLN: 25.0000 mg | INTRAMUSCULAR | Status: DC | PRN

## 2013-06-11 MED ORDER — MORPHINE SULFATE 0.5 MG/ML IJ SOLN
INTRAMUSCULAR | Status: AC
  Filled 2013-06-11: qty 10

## 2013-06-11 MED ORDER — BUPIVACAINE HCL (PF) 0.25 % IJ SOLN
INTRAMUSCULAR | Status: DC | PRN
  Administered 2013-06-11: 20 mL

## 2013-06-11 MED ORDER — ONDANSETRON HCL 4 MG PO TABS: 4.0000 mg | ORAL_TABLET | ORAL | Status: DC | PRN

## 2013-06-11 MED ORDER — MENTHOL 3 MG MT LOZG: 1.0000 | LOZENGE | OROMUCOSAL | Status: DC | PRN

## 2013-06-11 MED ORDER — LACTATED RINGERS IV SOLN
INTRAVENOUS | Status: DC | PRN
  Administered 2013-06-11: 19:00:00 via INTRAVENOUS

## 2013-06-11 MED ORDER — NALBUPHINE HCL 10 MG/ML IJ SOLN
5.0000 mg | INTRAMUSCULAR | Status: DC | PRN
  Filled 2013-06-11: qty 1

## 2013-06-11 MED ORDER — OXYTOCIN 10 UNIT/ML IJ SOLN
INTRAMUSCULAR | Status: AC
  Filled 2013-06-11: qty 4

## 2013-06-11 MED ORDER — LANOLIN HYDROUS EX OINT: 1.0000 "application " | TOPICAL_OINTMENT | CUTANEOUS | Status: DC | PRN

## 2013-06-11 MED ORDER — DIPHENHYDRAMINE HCL 50 MG/ML IJ SOLN
12.5000 mg | INTRAMUSCULAR | Status: DC | PRN
  Administered 2013-06-11 – 2013-06-12 (×3): 12.5 mg via INTRAVENOUS
  Filled 2013-06-11 (×2): qty 1

## 2013-06-11 MED ORDER — CEFAZOLIN SODIUM-DEXTROSE 2-3 GM-% IV SOLR
2.0000 g | Freq: Once | INTRAVENOUS | Status: AC
  Administered 2013-06-11: 2 g via INTRAVENOUS
  Filled 2013-06-11: qty 50

## 2013-06-11 MED ORDER — OXYCODONE-ACETAMINOPHEN 5-325 MG PO TABS
1.0000 | ORAL_TABLET | ORAL | Status: DC | PRN
  Administered 2013-06-12 – 2013-06-15 (×7): 1 via ORAL
  Filled 2013-06-11 (×7): qty 1

## 2013-06-11 MED ORDER — SODIUM CHLORIDE 0.9 % IJ SOLN: 3.0000 mL | INTRAMUSCULAR | Status: DC | PRN

## 2013-06-11 MED ORDER — ZOLPIDEM TARTRATE 5 MG PO TABS: 5.0000 mg | ORAL_TABLET | Freq: Every evening | ORAL | Status: DC | PRN

## 2013-06-11 MED ORDER — FERROUS SULFATE 325 (65 FE) MG PO TABS
325.0000 mg | ORAL_TABLET | Freq: Two times a day (BID) | ORAL | Status: DC
  Administered 2013-06-12 – 2013-06-15 (×7): 325 mg via ORAL
  Filled 2013-06-11 (×7): qty 1

## 2013-06-11 MED ORDER — OXYTOCIN 40 UNITS IN LACTATED RINGERS INFUSION - SIMPLE MED: 62.5000 mL/h | INTRAVENOUS | Status: AC

## 2013-06-11 MED ORDER — SCOPOLAMINE 1 MG/3DAYS TD PT72
MEDICATED_PATCH | TRANSDERMAL | Status: AC
  Filled 2013-06-11: qty 1

## 2013-06-11 MED ORDER — DEXTROSE 50 % IV SOLN
25.0000 mL | Freq: Once | INTRAVENOUS | Status: AC
  Administered 2013-06-11: 25 mL via INTRAVENOUS

## 2013-06-11 MED ORDER — SENNOSIDES-DOCUSATE SODIUM 8.6-50 MG PO TABS
2.0000 | ORAL_TABLET | ORAL | Status: DC
  Administered 2013-06-11 – 2013-06-14 (×3): 2 via ORAL
  Filled 2013-06-11 (×3): qty 2

## 2013-06-11 SURGICAL SUPPLY — 38 items
CLAMP CORD UMBIL (MISCELLANEOUS) IMPLANT
CLOTH BEACON ORANGE TIMEOUT ST (SAFETY) ×2 IMPLANT
CONTAINER PREFILL 10% NBF 15ML (MISCELLANEOUS) IMPLANT
DRAPE LG THREE QUARTER DISP (DRAPES) IMPLANT
DRSG OPSITE POSTOP 4X10 (GAUZE/BANDAGES/DRESSINGS) ×2 IMPLANT
DURAPREP 26ML APPLICATOR (WOUND CARE) ×2 IMPLANT
ELECT REM PT RETURN 9FT ADLT (ELECTROSURGICAL) ×2
ELECTRODE REM PT RTRN 9FT ADLT (ELECTROSURGICAL) ×1 IMPLANT
EXTRACTOR VACUUM M CUP 4 TUBE (SUCTIONS) IMPLANT
GLOVE BIO SURGEON STRL SZ 6.5 (GLOVE) ×2 IMPLANT
GLOVE BIOGEL PI IND STRL 7.0 (GLOVE) ×1 IMPLANT
GLOVE BIOGEL PI INDICATOR 7.0 (GLOVE) ×1
GOWN PREVENTION PLUS XLARGE (GOWN DISPOSABLE) ×4 IMPLANT
GOWN STRL REIN XL XLG (GOWN DISPOSABLE) ×4 IMPLANT
KIT ABG SYR 3ML LUER SLIP (SYRINGE) IMPLANT
NEEDLE HYPO 22GX1.5 SAFETY (NEEDLE) ×2 IMPLANT
NEEDLE HYPO 25X5/8 SAFETYGLIDE (NEEDLE) IMPLANT
PACK C SECTION WH (CUSTOM PROCEDURE TRAY) ×2 IMPLANT
PAD ABD 7.5X8 STRL (GAUZE/BANDAGES/DRESSINGS) ×2 IMPLANT
PAD OB MATERNITY 4.3X12.25 (PERSONAL CARE ITEMS) ×2 IMPLANT
RTRCTR C-SECT PINK 25CM LRG (MISCELLANEOUS) ×2 IMPLANT
STAPLER VISISTAT 35W (STAPLE) ×2 IMPLANT
STRIP CLOSURE SKIN 1/2X4 (GAUZE/BANDAGES/DRESSINGS) ×2 IMPLANT
SUT PLAIN 0 NONE (SUTURE) IMPLANT
SUT PLAIN 2 0 XLH (SUTURE) ×2 IMPLANT
SUT VIC AB 0 CT1 27 (SUTURE) ×2
SUT VIC AB 0 CT1 27XBRD ANBCTR (SUTURE) ×2 IMPLANT
SUT VIC AB 0 CTX 36 (SUTURE) ×2
SUT VIC AB 0 CTX36XBRD ANBCTRL (SUTURE) ×2 IMPLANT
SUT VIC AB 2-0 CT1 27 (SUTURE) ×1
SUT VIC AB 2-0 CT1 TAPERPNT 27 (SUTURE) ×1 IMPLANT
SUT VIC AB 3-0 SH 27 (SUTURE)
SUT VIC AB 3-0 SH 27X BRD (SUTURE) IMPLANT
SYR CONTROL 10ML LL (SYRINGE) ×2 IMPLANT
TAPE CLOTH SURG 4X10 WHT LF (GAUZE/BANDAGES/DRESSINGS) ×2 IMPLANT
TOWEL OR 17X24 6PK STRL BLUE (TOWEL DISPOSABLE) ×2 IMPLANT
TRAY FOLEY CATH 14FR (SET/KITS/TRAYS/PACK) ×2 IMPLANT
WATER STERILE IRR 1000ML POUR (IV SOLUTION) IMPLANT

## 2013-06-11 NOTE — Op Note (Signed)
Preoperative diagnosis: Intrauterine pregnancy at 37 weeks and 4 days                                            Mild Pre-Eclampsia                                            DM type 2 on Insulin                                            Pilar Plate Breech presentation   Post operative diagnosis: Same  Anesthesia: Spinal  Anesthesiologist: Dr. Royce Macadamia  Procedure: Primary elective low transverse cesarean section  Surgeon: Dr. Princess Bruins  Assistant: Dr. Azucena Fallen   Estimated blood loss: 750 cc  Procedure:  After being informed of the planned procedure and possible complications including bleeding, infection, injury to other organs, informed consent is obtained. The patient is taken to OR #9 and given spinal anesthesia without complication. She is placed in the dorsal decubitus position with the pelvis tilted to the left. She is then prepped and draped in a sterile fashion. A Foley catheter is inserted in her bladder.  After assessing adequate level of anesthesia, we infiltrate the suprapubic area with 20 cc of Marcaine 0.25 and perform a Pfannenstiel incision which is brought down sharply to the fascia. The fascia is entered in a low transverse fashion. Linea alba is dissected. Peritoneum is entered in a midline fashion. An Alexis retractor is easily positioned. Visceral peritoneum is entered in a low transverse fashion allowing [place] to safely retract bladder by developing a bladder flap.  The myometrium is then entered in a low transverse fashion; first with knife and then extended bluntly. Amniotic fluid is tinted meconium. We assist the birth of a female  infant in frank breech presentation. Mouth and nose are suctioned. The baby is delivered. The cord is clamped and sectioned. The baby is given to the neonatologist present in the room.  10 cc of blood is drawn from the umbilical vein.The placenta is allowed to deliver spontaneously. It is complete and the cord has 3 vessels. Uterine  revision is negative.  We proceed with closure of the myometrium in 2 layers: First with a running locked suture of 0 Vicryl, then with a Lembert suture of 0 Vicryl imbricating the first one. Hemostasis is completed with cauterization on peritoneal edges.  Both paracolic gutters are cleaned. Both tubes and ovaries are assessed and normal.  Satisfactory hemostasis is confirmed.  Retractors and sponges are removed. Under fascia hemostasis is completed with cauterization.  The parietal peritoneum is closed with a running suture of Vicryl 2-0. The fascia is then closed with 2 running sutures of 0 Vicryl meeting midline. The wound is irrigated with warm saline and hemostasis is completed with cauterization.  The adipose tissue is closed with a running suture of Plain 2-0. The skin is closed with staples and a honeycomb dressing is applied.  Instrument and sponge count is complete x2. Estimated blood loss is 750 cc.  The procedure is well tolerated by the patient who is taken to recovery room in a well and stable condition.  female baby named Waunita Schooner was born at 18:40 and received an Apgar of 8  at 1 minute and 9 at 5 minutes. Weight was pending.    Specimen: Placenta sent to patho.   Kenai Fluegel,MARIE-LYNE MD 1/19/20157:14 PM

## 2013-06-11 NOTE — Consult Note (Signed)
Neonatology Note:   Attendance at C-section:    I was asked by Dr. Dellis Filbert to attend this primary C/S at 37 4/7 weeks due to breech presentation and worsening PIH. The mother is a G1P0 AB pos, GBS pos with Type 2 IDDM, hypothyroidism. She presented yesterday with decreased fetal movement and elevated BP. NST was reactive. Known LGA infant. ROM at delivery, fluid clear. Delivered frank breech.  Infant vigorous with good spontaneous cry and tone. Needed only minimal bulb suctioning. Ap 8/9. Lungs clear to ausc in DR. To CN to care of Pediatrician.   Real Cons, MD

## 2013-06-11 NOTE — Progress Notes (Signed)
I visited with pt at her nurse's referral.  Whitney Holloway was feeling anxious about her scheduled C-Section tonight especially since she was not expecting it to take place today.  She is from [place] and has only been in the [place] for 10 months so she is nervous about going through surgery and delivery in a new country.  She has good support from her husband and her parents (who are here visiting for 2-3 months to help with the baby).  I helped her to re-focus her energy on her baby and this seemed to help her feel more settled and grounded.  I offered reflective listening and affirmations.  She and her family are Hindu.  I asked her if there were things that we should know about Hindu tradition around birth and she replied that there was not anything in particular for the birth, but that she felt strongly that her son not be circumcised.    Please page if other needs arise, [phone].  Ardath Sax Roper Tolson 12:56 PM   06/11/13 1200  Clinical Encounter Type  Visited With Patient  Visit Type Initial;Spiritual support  Referral From Nurse  Spiritual Encounters  Spiritual Needs Emotional

## 2013-06-11 NOTE — Anesthesia Procedure Notes (Signed)
Spinal  Patient location during procedure: OR Start time: 06/11/2013 6:14 PM Staffing Anesthesiologist: Kayo Zion A. Performed by: anesthesiologist  Preanesthetic Checklist Completed: patient identified, site marked, surgical consent, pre-op evaluation, timeout performed, IV checked, risks and benefits discussed and monitors and equipment checked Spinal Block Patient position: sitting Prep: site prepped and draped and DuraPrep Patient monitoring: heart rate, cardiac monitor, blood pressure and continuous pulse ox Approach: midline Location: L4-5 Injection technique: single-shot Needle Needle type: Sprotte  Needle gauge: 24 G Needle length: 9 cm Needle insertion depth: 6 cm Assessment Sensory level: T4 Additional Notes Patient tolerated procedure well. Adequate sensory level. Attempt x 2. Difficult due to poor patient positioning.

## 2013-06-11 NOTE — Progress Notes (Signed)
CSW received consult for: "Pt and family worried about how much this admission plus a possible emergency c-section would cost with their insurance ([employer])."  CSW contacted Care Manager/L. Arvella Nigh to speak with family regarding cost.  Wallace Keller will Banker if appropriate.  CSW screening out referral to social work at this time.

## 2013-06-11 NOTE — Progress Notes (Signed)
Hospital day # 1 pregnancy at [redacted]w[redacted]d Mild Pre-Eclampsia                                                               DM type 2 on Metformin/Insulin  S: well, reports good fetal activity.  No PIH Sx.      Contractions:none      Vaginal bleeding:none now       Vaginal discharge: no significant change  O: BP 128/90  Pulse 92  Temp(Src) 97.8 F (36.6 C) (Oral)  Resp 18  Ht 5\' 2"  (1.575 m)  Wt 86.456 kg (190 lb 9.6 oz)  BMI 34.85 kg/m2  LMP 08/25/2012      Fetal tracings:Fetal heart variability: moderate, accelerations present, no deceleration.      Reviewed and reassuring.      Uterus gravid and non-tender      Extremities: no significant edema and no signs of DVT       AST/ALT 212/266      Plt >300      Uric acid 5.0  A: [redacted]w[redacted]d with Mild Pre-Eclampsia.  BPs remain elevated with Diastolics in 15'Q.  AST/ALT mild increase x yesterday.       Plt wnl.  Pending 24 hr Urine.     Unchanged overall, will mild increase in Liver Enzymes.     FHR monitoring Category 1.       DM Metformin/Insulin     Breech, macrosomia      P: Keep NPO.  Decision to proceed with Primary C/S at 5 pm today (NPO x 8 hrs).  OR and Anesthesiologist informed.     Surgery and risks reviewed with patient including trauma, infection, hemorrhage, DVT/PE, Anesthesia Cx.  Risks of        worsening PEC vs risks of delivering at 37 4/7 wks with DM for fetal maturity discussed and patient and husband     agree with decision to deliver.  Pending 24 hr urine for Prtns.   Ricard Faulkner,MARIE-LYNE  MD 06/11/2013 11:37 AM

## 2013-06-11 NOTE — Progress Notes (Signed)
Ur chart review completed.  

## 2013-06-11 NOTE — Anesthesia Preprocedure Evaluation (Addendum)
Anesthesia Evaluation  Patient identified by MRN, date of birth, ID band Patient awake    Reviewed: Allergy & Precautions, H&P , NPO status , Patient's Chart, lab work & pertinent test results  Airway Mallampati: III TM Distance: >3 FB Neck ROM: Full    Dental no notable dental hx. (+) Teeth Intact   Pulmonary neg pulmonary ROS,  breath sounds clear to auscultation  Pulmonary exam normal       Cardiovascular hypertension, Rhythm:Regular Rate:Normal  Pre ecclampsia- mild   Neuro/Psych negative neurological ROS  negative psych ROS   GI/Hepatic negative GI ROS, Neg liver ROS,   Endo/Other  diabetes, Well Controlled, Type 2, Insulin DependentHypothyroidism Obesity  Renal/GU negative Renal ROS  negative genitourinary   Musculoskeletal negative musculoskeletal ROS (+)   Abdominal (+) + obese,   Peds  Hematology negative hematology ROS (+)   Anesthesia Other Findings   Reproductive/Obstetrics (+) Pregnancy                          Anesthesia Physical Anesthesia Plan  ASA: III  Anesthesia Plan: Spinal   Post-op Pain Management:    Induction:   Airway Management Planned: Natural Airway  Additional Equipment:   Intra-op Plan:   Post-operative Plan:   Informed Consent: I have reviewed the patients History and Physical, chart, labs and discussed the procedure including the risks, benefits and alternatives for the proposed anesthesia with the patient or authorized representative who has indicated his/her understanding and acceptance.     Plan Discussed with: CRNA, Anesthesiologist and Surgeon  Anesthesia Plan Comments:         Anesthesia Quick Evaluation

## 2013-06-11 NOTE — Transfer of Care (Signed)
Immediate Anesthesia Transfer of Care Note  Patient: Whitney Holloway  Procedure(s) Performed: Procedure(s): CESAREAN SECTION (N/A)  Patient Location: PACU  Anesthesia Type:Spinal  Level of Consciousness: awake  Airway & Oxygen Therapy: Patient Spontanous Breathing  Post-op Assessment: Report given to PACU RN and Post -op Vital signs reviewed and stable  Post vital signs: stable  Complications: No apparent anesthesia complications

## 2013-06-11 NOTE — Anesthesia Postprocedure Evaluation (Signed)
  Anesthesia Post-op Note  Anesthesia Post Note  Patient: Whitney Holloway  Procedure(s) Performed: Procedure(s) (LRB): CESAREAN SECTION (N/A)  Anesthesia type: Spinal  Patient location: PACU  Post pain: Pain level controlled  Post assessment: Post-op Vital signs reviewed  Last Vitals:  Filed Vitals:   06/11/13 2200  BP: 143/85  Pulse: 27  Temp: 37 C  Resp: 23    Post vital signs: stable  Level of consciousness: awake  Complications: No apparent anesthesia complications

## 2013-06-12 ENCOUNTER — Encounter (HOSPITAL_COMMUNITY): Payer: Self-pay | Admitting: Obstetrics & Gynecology

## 2013-06-12 LAB — COMPREHENSIVE METABOLIC PANEL
ALT: 217 U/L — ABNORMAL HIGH (ref 0–35)
AST: 180 U/L — ABNORMAL HIGH (ref 0–37)
Albumin: 1.7 g/dL — ABNORMAL LOW (ref 3.5–5.2)
Alkaline Phosphatase: 231 U/L — ABNORMAL HIGH (ref 39–117)
BUN: 7 mg/dL (ref 6–23)
CALCIUM: 8.4 mg/dL (ref 8.4–10.5)
CHLORIDE: 105 meq/L (ref 96–112)
CO2: 25 meq/L (ref 19–32)
CREATININE: 0.65 mg/dL (ref 0.50–1.10)
GFR calc non Af Amer: >90 mL/min (ref >90)
Glucose, Bld: 39 mg/dL — CL (ref 70–99)
Potassium: 4.4 mEq/L (ref 3.7–5.3)
Sodium: 140 mEq/L (ref 137–147)
Total Bilirubin: 0.8 mg/dL (ref 0.3–1.2)
Total Protein: 5.5 g/dL — ABNORMAL LOW (ref 6.0–8.3)

## 2013-06-12 LAB — GLUCOSE, CAPILLARY
GLUCOSE-CAPILLARY: 100 mg/dL — AB (ref 70–99)
GLUCOSE-CAPILLARY: 144 mg/dL — AB (ref 70–99)
GLUCOSE-CAPILLARY: 123 mg/dL — AB (ref 70–99)
Glucose-Capillary: 143 mg/dL — ABNORMAL HIGH (ref 70–99)

## 2013-06-12 LAB — CMV ANTIBODY, IGG (EIA): CMV Ab - IgG: >10 U/mL — ABNORMAL HIGH (ref <0.60)

## 2013-06-12 LAB — CBC
HEMATOCRIT: 33.7 % — AB (ref 36.0–46.0)
Hemoglobin: 11 g/dL — ABNORMAL LOW (ref 12.0–15.0)
MCH: 26.2 pg (ref 26.0–34.0)
MCHC: 32.6 g/dL (ref 30.0–36.0)
MCV: 80.2 fL (ref 78.0–100.0)
PLATELETS: 327 10*3/uL (ref 150–400)
RBC: 4.2 MIL/uL (ref 3.87–5.11)
RDW: 15.3 % (ref 11.5–15.5)
WBC: 9.4 10*3/uL (ref 4.0–10.5)

## 2013-06-12 LAB — URIC ACID: Uric Acid, Serum: 5.6 mg/dL (ref 2.4–7.0)

## 2013-06-12 LAB — CMV IGM: CMV IgM: 17 AU/mL (ref <30.00)

## 2013-06-12 LAB — HEPATITIS B DNA, ULTRAQUANTITATIVE, PCR: Hepatitis B DNA: NOT DETECTED IU/mL (ref <20)

## 2013-06-12 MED ORDER — LACTATED RINGERS IV SOLN
INTRAVENOUS | Status: DC
  Administered 2013-06-12: 20:00:00 via INTRAVENOUS

## 2013-06-12 MED ORDER — LEVOTHYROXINE SODIUM 100 MCG PO TABS
100.0000 ug | ORAL_TABLET | Freq: Every day | ORAL | Status: DC
  Administered 2013-06-12 – 2013-06-15 (×4): 100 ug via ORAL
  Filled 2013-06-12 (×5): qty 1

## 2013-06-12 NOTE — Progress Notes (Signed)
Patient ID: Whitney Holloway, female   DOB: 29-Aug-1980, 33 y.o.   MRN: [id number] Subjective: POD# 1 Information for the patient's newborn:  Whitney, Holloway [[id number]]  female  / circ no  Reports feeling well Feeding: breast and bottle Patient reports tolerating PO.  Breast symptoms: none Pain controlled with ibuprofen (OTC) and narcotic analgesics including Percocet Denies HA/SOB/C/P/N/V/dizziness. Flatus no. She reports vaginal bleeding as normal, without clots.  She is ambulating, urinating without difficult.     Objective:   VS:  Filed Vitals:   06/12/13 0615 06/12/13 0700 06/12/13 0813 06/12/13 0921  BP: 142/72 123/75 111/75 113/78  Pulse:   84   Temp:   98.4 F (36.9 C)   TempSrc:   Oral   Resp:  18 18   Height:      Weight:      SpO2:   97%      Intake/Output Summary (Last 24 hours) at 06/12/13 1004 Last data filed at 06/12/13 0800  Gross per 24 hour  Intake 5555.67 ml  Output   4450 ml  Net 1105.67 ml        Recent Labs  06/11/13 1220 06/12/13 0510  WBC 8.0 9.4  HGB 12.7 11.0*  HCT 38.5 33.7*  PLT 404* 327     Blood type: --/--/AB POS, AB POS (01/19 1220)  Rubella: Immune (07/29 0000)     Physical Exam:  General: alert, cooperative and no distress CV: Regular rate and rhythm, S1S2 present or without murmur or extra heart sounds Resp: clear Abdomen: soft, nontender, normal bowel sounds Incision: clean, dry, intact and pressure dressing intact Uterine Fundus: firm, @ umbilicus, nontender Lochia: minimal Ext: extremities normal, atraumatic, no cyanosis or edema and Homans sign is negative, no sign of DVT      Assessment/Plan: 33 y.o.   POD# 1.  s/p Cesarean Delivery.  Indications: Mild Pre-Eclampsia, Type 2 IDDM, Pilar Plate Breech Presentation                 Doing well, stable.               Carb modified diet as tolerated Maintain Foley Consider D/Cing Magnesium Sulfate after reviewing PIH labs and BPs Ambulate Routine post-op care  *Dr.  Dellis Filbert consulted on plan of care  Whitney Congress, MSN, CNM 06/12/2013, 10:04 AM

## 2013-06-12 NOTE — Lactation Note (Signed)
This note was copied from the chart of Boy Israel. Lactation Consultation Note     Initial consult with this mom of a term baby, mom in AICU magnesium drip. Mom wants to breast feed and formula feed. Mom and baby are now 20  Hours post partum. She has ben breast feeding, and then offering formula after. The baby at 1200 today was spitty. I explained to mom that she was probably over feeing the baby, for the first day of life. I reviewed cue based feeding with mom, and LEAD - why to avoid formula. Baby and me book breast feeding pages reviewed. i assisted mom with cross cradle hold, and showed her how to position herself and baby , and obtain a deep latch. Mom will need more teaching to reinforce latching. Mom knows to call for questions/conerns.  Patient Name: Boy Sejal Cofield HENID'P Date: 06/12/2013 Reason for consult: Initial assessment   Maternal Data Formula Feeding for Exclusion: Yes Reason for exclusion: Mother's choice to formula and breast feed on admission;Admission to Intensive Care Unit (ICU) post-partum (mom in [place]) Infant to breast within first hour of birth: No Has patient been taught Hand Expression?: Yes Does the patient have breastfeeding experience prior to this delivery?: No  Feeding Feeding Type: Breast Fed  LATCH Score/Interventions Latch: Repeated attempts needed to sustain latch, nipple held in mouth throughout feeding, stimulation needed to elicit sucking reflex. Intervention(s): Adjust position;Assist with latch;Breast massage;Breast compression  Audible Swallowing: None Intervention(s): Skin to skin;Hand expression Intervention(s): Skin to skin  Type of Nipple: Everted at rest and after stimulation  Comfort (Breast/Nipple): Soft / non-tender     Hold (Positioning): Assistance needed to correctly position infant at breast and maintain latch. Intervention(s): Breastfeeding basics reviewed;Support Pillows;Position options;Skin to skin  LATCH Score:  6  Lactation Tools Discussed/Used     Consult Status Consult Status: Follow-up Date: 06/13/13 Follow-up type: In-patient    Tonna Corner 06/12/2013, 2:49 PM

## 2013-06-12 NOTE — Progress Notes (Addendum)
Inpatient Diabetes Program Recommendations  AACE/ADA: New Consensus Statement on Inpatient Glycemic Control (2013)  Target Ranges:  Prepandial:   less than 140 mg/dL      Peak postprandial:   less than 180 mg/dL (1-2 hours)      Critically ill patients:  140 - 180 mg/dL   Results for Whitney Holloway, Whitney Holloway (MRN [id number]) as of 06/12/2013 07:41  Ref. Range 06/11/2013 10:54 06/11/2013 14:17 06/11/2013 16:05 06/11/2013 17:09 06/11/2013 18:05 06/11/2013 19:31 06/11/2013 20:35 06/11/2013 21:55 06/12/2013 06:48  Glucose-Capillary Latest Range: 70-99 mg/dL 89 55 (L) 56 (L) 64 (L) 57 (L) 42 (LL) 36 (LL) 56 (L) 100 (H)    Inpatient Diabetes Program Recommendations Insulin - Basal: Please discontinue NPH. Correction (SSI): Please order Glycemic Control order set with CBGs and Novolog sensitive correction Q4H.  Note: Noted patient has a history of diabetes type 2 prior to pregnancy.  During pregnancy patient was placed on insulin for diabetes control but prior to pregnancy diabetes was managed with Metformin.  Patient was given last dose of NPH 40 units on 06/11/13 @ 7:56 and has had several low blood sugars since then.  Note patient had a c-section last night.  Anticipate insulin needs and resistance to be much lower now post-delivery.  Please discontinue NPH and order Glycemic Control order set with CBGs and Novolog sensitive correction Q4H.  Will continue to follow.  06/12/13@8 :38- Called ICU [phone]) and spoke with Cyril Mourning, RN to inform of above noted recommendations and asked that she discuss recommendations with MD prior to giving any NPH this morning.  Thanks, Barnie Alderman, RN, MSN, CCRN Diabetes Coordinator Inpatient Diabetes Program [phone] (Team Pager) [phone] ([facility] office) [phone] [facility] office)

## 2013-06-12 NOTE — Progress Notes (Signed)
UR chart review completed.  

## 2013-06-12 NOTE — Progress Notes (Signed)
Results for [name], [name] (MRN [id number]) as of 06/12/2013 06:03  Ref. Range 06/12/2013 05:10  Glucose Latest Range: 70-99 mg/dL 39 (LL)  Report to T. Mel Almond, CNM.  Pt alert and responsive, ambulatory without c/o vertigo, lightheaded.  States BS have been 40-50"s past week.  Given crackers, Sprite.  D5LR infusing.  Will repeat BS in 1 hr.

## 2013-06-13 ENCOUNTER — Encounter (HOSPITAL_COMMUNITY): Payer: Self-pay | Admitting: Obstetrics and Gynecology

## 2013-06-13 LAB — COMPREHENSIVE METABOLIC PANEL
ALBUMIN: 1.8 g/dL — AB (ref 3.5–5.2)
ALK PHOS: 226 U/L — AB (ref 39–117)
ALT: 136 U/L — AB (ref 0–35)
AST: 72 U/L — ABNORMAL HIGH (ref 0–37)
BILIRUBIN TOTAL: 0.6 mg/dL (ref 0.3–1.2)
BUN: 8 mg/dL (ref 6–23)
CHLORIDE: 104 meq/L (ref 96–112)
CO2: 24 mEq/L (ref 19–32)
Calcium: 8.1 mg/dL — ABNORMAL LOW (ref 8.4–10.5)
Creatinine, Ser: 0.66 mg/dL (ref 0.50–1.10)
GFR calc Af Amer: >90 mL/min (ref >90)
GFR calc non Af Amer: >90 mL/min (ref >90)
Glucose, Bld: 120 mg/dL — ABNORMAL HIGH (ref 70–99)
POTASSIUM: 4.8 meq/L (ref 3.7–5.3)
SODIUM: 138 meq/L (ref 137–147)
Total Protein: 5.7 g/dL — ABNORMAL LOW (ref 6.0–8.3)

## 2013-06-13 LAB — GLUCOSE, CAPILLARY
Glucose-Capillary: 94 mg/dL (ref 70–99)
Glucose-Capillary: 77 mg/dL (ref 70–99)

## 2013-06-13 MED ORDER — INSULIN ASPART PROT & ASPART (70-30 MIX) 100 UNIT/ML ~~LOC~~ SUSP
16.0000 [IU] | Freq: Every day | SUBCUTANEOUS | Status: DC
  Administered 2013-06-13: 16 [IU] via SUBCUTANEOUS
  Administered 2013-06-14: 8 [IU] via SUBCUTANEOUS
  Filled 2013-06-13: qty 10

## 2013-06-13 MED ORDER — INSULIN ASPART PROT & ASPART (70-30 MIX) 100 UNIT/ML ~~LOC~~ SUSP
16.0000 [IU] | Freq: Every day | SUBCUTANEOUS | Status: DC
  Administered 2013-06-13: 16 [IU] via SUBCUTANEOUS

## 2013-06-13 NOTE — Lactation Note (Signed)
This note was copied from the chart of Whitney Israel. Lactation Consultation Note     Follow up consult with this mom and baby, now 41 hours post partum. Mom had the baby in the CNS bottle feeding last night. On exam, mom's breasts do not feel as if they are lactating, and I was nto able to express even a drop of colostrum. Mom is going to continue latching the baby today, with cues, and then offer formula each time. She knows to call for lactation assistance, as needed.   Patient Name: Whitney Holloway XFGHW'E Date: 06/13/2013 Reason for consult: Follow-up assessment   Maternal Data    Feeding    LATCH Score/Interventions                      Lactation Tools Discussed/Used     Consult Status Consult Status: Follow-up Date: 06/14/13 Follow-up type: In-patient    Tonna Corner 06/13/2013, 11:55 AM

## 2013-06-13 NOTE — Progress Notes (Signed)
PPD 2  S: Feels well. / PEC symptoms: no HA, no vision change, no RUQ pain, no CP/SOB/ Tolerating po/ No n/v/ Ambulating well/ Voiding frequently/ Flatus passing    O: A&O x 3 / VS Blood pressure 131/75, pulse 86, temperature 98 F (36.7 C), temperature source Oral, resp. rate 16, height 5\' 2"  (1.575 m), weight 81.058 kg (178 lb 11.2 oz), last menstrual period 08/25/2012, SpO2 97.00%, unknown if currently breastfeeding.  Labs: see flow, nl Cr, nl Plt, LFT's improving  I&O: I/O last 3 completed shifts: In: 7198.2 [P.O.:1680; I.V.:5518.2] Out: 69 [Urine:8200; Blood:750] 3L out overnight, still positive total fluids but <500cc pos and neg from yest      Lungs: clear, no crackles  Heart: RRR  Abdomen: appropriately tender, ND, incision dressed/ clean    Extremities: trace edema, 1+ DTR, no clonus  A/P: Resolving PEC. D/c MAg, transition to floor. Labs improving, consider repeat once prior to d/c - T2DM. BS low in the am then on D5 drip all day w/ mild elevations, D5 d/c last pm. As pt now tolerating po, will resume insulin at half her pregnancy dose. Follow BS closely.   Whitney Holloway A. 06/13/2013 7:49 AM

## 2013-06-14 LAB — GLUCOSE, CAPILLARY
GLUCOSE-CAPILLARY: 69 mg/dL — AB (ref 70–99)
GLUCOSE-CAPILLARY: 75 mg/dL (ref 70–99)
GLUCOSE-CAPILLARY: 87 mg/dL (ref 70–99)
Glucose-Capillary: 56 mg/dL — ABNORMAL LOW (ref 70–99)
Glucose-Capillary: 100 mg/dL — ABNORMAL HIGH (ref 70–99)
Glucose-Capillary: 109 mg/dL — ABNORMAL HIGH (ref 70–99)
Glucose-Capillary: 98 mg/dL (ref 70–99)

## 2013-06-14 MED ORDER — INSULIN ASPART PROT & ASPART (70-30 MIX) 100 UNIT/ML ~~LOC~~ SUSP: 8.0000 [IU] | Freq: Once | SUBCUTANEOUS | Status: AC

## 2013-06-14 NOTE — Lactation Note (Signed)
This note was copied from the chart of Whitney Israel. Lactation Consultation Note Follow up consult:  Baby 7 hours old and baby sleepy.  Reviewed waking techniques, cluster feeding, breastfeeding 8-12 times a day, engorgement care and lactation support services.  Undressed baby and demonstrated to mother how to wake baby for feedings.  Baby too sleepy to latch.  Encouraged mother to wake baby in one hour and try again.  Loma Sousa RN is also encouraging mother to put baby to the breast based on feeding cues.  Encouraged mother to call for further assistance.   Patient Name: Whitney Holloway JJHER'D Date: 06/14/2013 Reason for consult: Follow-up assessment   Maternal Data    Feeding Feeding Type: Breast Fed  LATCH Score/Interventions Latch: Grasps breast easily, tongue down, lips flanged, rhythmical sucking.  Audible Swallowing: Spontaneous and intermittent  Type of Nipple: Everted at rest and after stimulation  Comfort (Breast/Nipple): Soft / non-tender     Hold (Positioning): Assistance needed to correctly position infant at breast and maintain latch.  LATCH Score: 9  Lactation Tools Discussed/Used     Consult Status Consult Status: Follow-up Date: 06/15/13 Follow-up type: In-patient    Vivianne Master [facility] 06/14/2013, 12:14 PM

## 2013-06-14 NOTE — Progress Notes (Signed)
Patient ID: Whitney Holloway, female   DOB: 15-Jul-1980, 33 y.o.   MRN: [id number] POD # 3  Subjective: Pt reports feeling well; some discomfort in feet d/t fluid retention/ Pain controlled with ibuprofen and percocet Denies HA, visual scotoma, epigastric pain, SOB Tolerating po/Voiding without problems/ No n/v/Flatus pos Activity: out of bed and ambulate Bleeding is light Newborn info:  Information for the patient's newborn:  Whitney, Holloway [[id number]]  female Feeding: breast   Objective: VS: BP 125/82  Pulse 75  Temp(Src) 97.9 F (36.6 C) (Oral)  Resp 16     Physical Exam:  General: alert, cooperative and no distress CV: Regular rate and rhythm Resp: clear Abdomen: soft, nontender, normal bowel sounds; mod dependent edema; large pressure dressing still in place Uterine Fundus: firm, below umbilicus, nontender Incision: Covered with Tegaderm and honeycomb dressing; well approximated. Lochia: minimal Ext: edema +2 pedal and pretib and Homans sign is negative, no sign of DVT    A/P: POD # 2/ G1P1001/ S/P C/Section d/t PEC, IDDM and breech @ 37w 4d Significant hypoglycemia this am and post op day 2 Consult with Dr Ronita Hipps.  Will give half am insulin dose, contact Dr Chalmers Cater for further mgmt plans, and monitor closely Shower today and remove pressure dressing. Continue routine other post op orders Anticipate discharge home in the am   Signed: Darleen Crocker, MSN, [facility] 06/14/2013, 9:30 AM

## 2013-06-14 NOTE — Lactation Note (Addendum)
This note was copied from the chart of Whitney Holloway. Lactation Consultation Note Follow up visit at 91 hours of age.  Mom reports baby is still sleepy, but just finished a 15 minutes breast feeding and she heard swallows.  Previous latch scores are 9.  Baby has not had a stool in > 12 hours, and has had a few voids.  Encouraged mom to wake baby and place skin to skin every 3 hours if baby is not showing feeding cues.  Mom reports she is able to do hand expression and sees colostrum.  Discussed baby should breast feed for about 15 minutes 8-10 times in the next 24 hours and keep a close record of feedings and attempts.  Mom has questions about purchasing a DEBP, encouraged her to call her insurance company and see if they will give her one.  Mom to call for assist as needed. After reviewing moms chart and history of type 2 diabetes and hypothyroidism, mom agrees to using DEBP with feedings for breast stimulation.  Set up and discussed use and cleaning.   Patient Name: Whitney Holloway TKPTW'S Date: 06/14/2013 Reason for consult: Follow-up assessment   Maternal Data    Feeding Feeding Type: Breast Fed Length of feed: 15 min  LATCH Score/Interventions                      Lactation Tools Discussed/Used     Consult Status Consult Status: Follow-up Date: 06/15/13 Follow-up type: In-patient    Whitney Holloway, Whitney Holloway 06/14/2013, 4:59 PM

## 2013-06-14 NOTE — Progress Notes (Signed)
Patient ID: Whitney Holloway, female   DOB: 1980-08-01, 33 y.o.   MRN: [id number] Additional data:  Phone consult with Dr Suzette Battiest.  She recommends stop insulin at this time and she will f/u with pt in her office on Monday, 06/18/13 @ 1:15.  Dr Dellis Filbert advised of plan of care.  Gustavo Lah, MSN, [facility] 06/14/13 16:10

## 2013-06-14 NOTE — Progress Notes (Signed)
Consulted Gustavo Lah, NP regarding patients blood glucose.  Orders received for 8 units of novolog 70/30 this am per Dr. Ronita Hipps.

## 2013-06-14 NOTE — Progress Notes (Signed)
This was a follow up with pt and her family.  I first met Whitney Holloway on antenatal on the day of her C-Section.  She was anxious at the time and today expressed her gratitude for the previous visit.  They were delighted with their son and with their new roles as parents. They were interested in going to the chapel to have prayer on their own and I let them know where it is located.  They also asked if I would keep their son in my prayers.  They were grateful for the follow-up visit as well.  Barry Pager, [phone] 11:53 AM   06/14/13 1100  Clinical Encounter Type  Visited With Patient and family together  Visit Type Spiritual support;Follow-up  Spiritual Encounters  Spiritual Needs Emotional

## 2013-06-15 ENCOUNTER — Encounter: Payer: Self-pay | Admitting: Gynecology

## 2013-06-15 ENCOUNTER — Encounter (HOSPITAL_COMMUNITY)
Admission: RE | Admit: 2013-06-15 | Discharge: 2013-06-15 | Disposition: A | Discharge status: Home / Self Care | Payer: 59 | Source: Ambulatory Visit | Attending: Obstetrics & Gynecology | Admitting: Obstetrics & Gynecology

## 2013-06-15 DIAGNOSIS — O923 Agalactia: Secondary | ICD-10-CM | POA: Insufficient documentation

## 2013-06-15 LAB — GLUCOSE, CAPILLARY
GLUCOSE-CAPILLARY: 142 mg/dL — AB (ref 70–99)
Glucose-Capillary: 87 mg/dL (ref 70–99)
Glucose-Capillary: 131 mg/dL — ABNORMAL HIGH (ref 70–99)

## 2013-06-15 LAB — RPR: RPR: NONREACTIVE

## 2013-06-15 MED ORDER — METFORMIN HCL 500 MG PO TABS: 500.0000 mg | ORAL_TABLET | Freq: Two times a day (BID) | ORAL | Status: AC

## 2013-06-15 MED ORDER — OXYCODONE-ACETAMINOPHEN 7.5-325 MG PO TABS: 1.0000 | ORAL_TABLET | ORAL | Status: AC | PRN

## 2013-06-15 NOTE — Discharge Summary (Signed)
   Obstetric Discharge Summary Reason for Admission: Pre-Eclampsia Prenatal Procedures: Preeclampsia Intrapartum Procedures: cesarean: low cervical, transverse Postpartum Procedures: none Complications-Operative and Postpartum: none  Temp:  [97.5 F (36.4 C)-97.8 F (36.6 C)] 97.5 F (36.4 C) (01/23 0605) Pulse Rate:  [78-81] 81 (01/23 0605) Resp:  [18] 18 (01/23 0605) BP: (128-135)/(75-87) 135/87 mmHg (01/23 0605) Hemoglobin  Date Value Range Status  06/12/2013 11.0* 12.0 - 15.0 g/dL Final  12/19/2012 12.5   Final     HCT  Date Value Range Status  06/12/2013 33.7* 36.0 - 46.0 % Final  04/16/2013 38   Final    Hospital Course:  Good  Discharge Diagnoses: Preelampsia and DM type 2 on Insulin and breech presentation  Discharge Information: Date: 06/15/2013 Activity: PO progressive activity Diet: Diabetic Medications:    Medication List    STOP taking these medications       NOVOLIN 70/30 (70-30) 100 UNIT/ML injection  Generic drug:  insulin NPH-regular Human      TAKE these medications       levothyroxine 100 MCG tablet  Commonly known as:  SYNTHROID, LEVOTHROID  Take 100 mcg by mouth daily before breakfast.     metFORMIN 500 MG tablet  Commonly known as:  GLUCOPHAGE  Take 1 tablet (500 mg total) by mouth 2 (two) times daily with a meal.     oxyCODONE-acetaminophen 7.5-325 MG per tablet  Commonly known as:  PERCOCET  Take 1 tablet by mouth every 4 (four) hours as needed for pain.     prenatal multivitamin Tabs tablet  Take 1 tablet by mouth daily at 12 noon.       Condition: stable Instructions: refer to practice specific booklet Discharge to: home     Follow-up Information   Follow up with Kaymon Denomme,MARIE-LYNE, MD In 6 weeks.   Specialty:  Obstetrics and Gynecology   Contact information:   [address] [place] [address] [phone]       Newborn Data: Live born  Information for the patient's newborn:  Jurnie, Garritano [[id number]]   female ; APGAR , ; weight ;  Home with mother.  Yaroslav Gombos,MARIE-LYNE 06/15/2013, 10:48 AM

## 2013-06-15 NOTE — Lactation Note (Addendum)
This note was copied from the chart of Whitney Holloway. Lactation Consultation Note  Patient Name: Whitney Rozalia Dino HWEXH'B Date: 06/15/2013 Reason for consult: Follow-up assessment;Infant weight loss;Late preterm infant;Other (Comment) (Mom IDDM, post mag, hypothyroidism.) Baby patient, 51 hours old, has 9% weight loss. Mom reports breastfeeding going well now. Mom has pumped several time during the night. Mom more comfortable with getting a deeper latch. Assisted mom to latch baby. Reviewed waking techniques and position--getting baby up to the breast with pillows. Enc mom to compress breast for deep latch and to massage while baby nurses. Baby passing gas while nursing. Reviewed engorgement prevention/treatment. Mom aware of [facility] OP/BFSG services. Mom rented a DEBP from [facility].   Maternal Data    Feeding Feeding Type: Breast Fed Length of feed: 20 min  LATCH Score/Interventions Latch: Grasps breast easily, tongue down, lips flanged, rhythmical sucking. Intervention(s): Adjust position;Assist with latch;Breast compression  Audible Swallowing: Spontaneous and intermittent  Type of Nipple: Everted at rest and after stimulation  Comfort (Breast/Nipple): Soft / non-tender     Hold (Positioning): Assistance needed to correctly position infant at breast and maintain latch. Intervention(s): Breastfeeding basics reviewed;Support Pillows  LATCH Score: 9  Lactation Tools Discussed/Used     Consult Status Consult Status: Complete Date: 06/15/13    Inocente Salles 06/15/2013, 11:42 AM

## 2013-06-15 NOTE — Progress Notes (Signed)
Patient's blood sugar 2 hours after dinner (0015) was 142. No parameters were addressed in patient's orders. Insulin had been discontinued. Contacted Dr. Dellis Filbert to notify her of the patient's blood sugar. She said the blood sugar(s) would be addressed during patient rounds in the morning (06/15/13). Gave instructions to contact MD in the morning if patient's blood sugar is 200 or greater. Maricela Bo Niger, RN, BSN 1:38 AM

## 2013-06-15 NOTE — Progress Notes (Signed)
S:   PO C/S day #4  DM type 2 on no Insulin/Metformin.  Mild PEC.             Feels good/pain mild/tolerating po diabetic diet/N/V none/Ambulating well/Voiding /Flatus present  Breast normal, breast feeding  Newborn  well  O: A&O x 3/ Filed Vitals:   06/15/13 0605  BP: 135/87  Pulse: 81  Temp: 97.5 F (36.4 C)  Resp: 18     Labs:  Lab Results  Component Value Date   WBC 9.4 06/12/2013   HGB 11.0* 06/12/2013   HCT 33.7* 06/12/2013   MCV 80.2 06/12/2013   PLT 327 06/12/2013     No intake or output data in the 24 hours ending 06/15/13 1027   Lab Results  Component Value Date   WBC 9.4 06/12/2013   HGB 11.0* 06/12/2013   HCT 33.7* 06/12/2013   MCV 80.2 06/12/2013   PLT 327 06/12/2013   AST/ALT 72/136 on 1/21 improving. FBS 87 this am PP diner 142 last night   Lungs: clear  Heart: rcr  Abdomen: soft/Bowel sounds: present/Dressing: dry/Insicion: intact  Lochia: normal  Extremities: normal, no oedema           no calf pain/tenderness                         A/P: POD#4C/S/G1P1001  D/C Home on Metformin.  F/U Dr Chalmers Cater by phone, f/u in her office 2/105.             F/U [facility]Ob[facility]yn 6 wks, will repeat AST/ALT at that visit.

## 2013-06-15 NOTE — Discharge Instructions (Signed)
Preeclampsia and Eclampsia °Preeclampsia is a condition of high blood pressure during pregnancy. It can happen at 20 weeks or later in pregnancy. If high blood pressure occurs in the second half of pregnancy with no other symptoms, it is called gestational hypertension and goes away after the baby is born. If any of the symptoms listed below develop with gestational hypertension, it is then called preeclampsia. Eclampsia (convulsions) may follow preeclampsia. This is one of the reasons for regular prenatal checkups. Early diagnosis and treatment are very important to prevent eclampsia. °CAUSES  °There is no known cause of preeclampsia/eclampsia in pregnancy. There are several known conditions that may put the pregnant woman at risk, such as: °· The first pregnancy. °· Having preeclampsia in a past pregnancy. °· Having lasting (chronic) high blood pressure. °· Having multiples (twins, triplets). °· Being age 35 or older. °· African American ethnic background. °· Having kidney disease or diabetes. °· Medical conditions such as lupus or blood diseases. °· Being overweight (obese). °SYMPTOMS  °· High blood pressure. °· Headaches. °· Sudden weight gain. °· Swelling of hands, face, legs, and feet. °· Protein in the urine. °· Feeling sick to your stomach (nauseous) and throwing up (vomiting). °· Vision problems (blurred or double vision). °· Numbness in the face, arms, legs, and feet. °· Dizziness. °· Slurred speech. °· Preeclampsia can cause growth retardation in the fetus. °· Separation (abruption) of the placenta. °· Not enough fluid in the amniotic sac (oligohydramnios). °· Sensitivity to bright lights. °· Belly (abdominal) pain. °DIAGNOSIS  °If protein is found in the urine in the second half of pregnancy, this is considered preeclampsia. Other symptoms mentioned above may also be present. °TREATMENT  °It is necessary to treat this. °· Your caregiver may prescribe bed rest early in this condition. Plenty of rest and  salt restriction may be all that is needed. °· Medicines may be necessary to lower blood pressure if the condition does not respond to more conservative measures. °· In more severe cases, hospitalization may be needed: °· For treatment of blood pressure. °· To control fluid retention. °· To monitor the baby to see if the condition is causing harm to the baby. °· Hospitalization is the best way to treat the first sign of preeclampsia. This is so the mother and baby can be watched closely and blood tests can be done effectively and correctly. °· If the condition becomes severe, it may be necessary to induce labor or to remove the infant by surgical means (cesarean section). The best cure for preeclampsia/eclampsia is to deliver the baby. °Preeclampsia and eclampsia involve risks to mother and infant. Your caregiver will discuss these risks with you. Together, you can work out the best possible approach to your problems. Make sure you keep your prenatal visits as scheduled. Not keeping appointments could result in a chronic or permanent injury, pain, disability to you, and death or injury to you or your unborn baby. If there is any problem keeping the appointment, you must call to reschedule. °HOME CARE INSTRUCTIONS  °· Keep your prenatal appointments and tests as scheduled. °· Tell your caregiver if you have any of the above risk factors. °· Get plenty of rest and sleep. °· Eat a balanced diet that is low in salt, and do not add salt to your food. °· Avoid stressful situations. °· Only take over-the-counter and prescriptions medicines for pain, discomfort, or fever as directed by your caregiver. °SEEK IMMEDIATE MEDICAL CARE IF:  °· You develop severe swelling   anywhere in the body. This usually occurs in the legs. °· You gain 05 lb/2.3 kg or more in a week. °· You develop a severe headache, dizziness, problems with your vision, or confusion. °· You have abdominal pain, nausea, or vomiting. °· You have a seizure. °· You  have trouble moving any part of your body, or you develop numbness or problems speaking. °· You have bruising or abnormal bleeding from anywhere in the body. °· You develop a stiff neck. °· You pass out. °MAKE SURE YOU:  °· Understand these instructions. °· Will watch your condition. °· Will get help right away if you are not doing well or get worse. °Document Released: 05/07/2000 Document Revised: 08/02/2011 Document Reviewed: 12/22/2007 °[employer]® Patient Information ©2014 [employer]. ° °

## 2013-06-21 ENCOUNTER — Inpatient Hospital Stay (HOSPITAL_COMMUNITY): Admission: AD | Admit: 2013-06-21 | Payer: 59 | Source: Ambulatory Visit | Admitting: Obstetrics & Gynecology

## 2013-06-21 SURGERY — Surgical Case
Anesthesia: Spinal

## 2013-08-26 ENCOUNTER — Encounter: Payer: Self-pay | Admitting: Gynecology

## 2013-09-03 ENCOUNTER — Ambulatory Visit (HOSPITAL_COMMUNITY): Payer: 59

## 2013-09-05 ENCOUNTER — Encounter (HOSPITAL_COMMUNITY)
Admission: RE | Admit: 2013-09-05 | Discharge: 2013-09-05 | Disposition: A | Discharge status: Home / Self Care | Payer: 59 | Source: Ambulatory Visit | Attending: Obstetrics & Gynecology | Admitting: Obstetrics & Gynecology

## 2013-09-05 ENCOUNTER — Ambulatory Visit (HOSPITAL_COMMUNITY)
Admission: RE | Admit: 2013-09-05 | Discharge: 2013-09-05 | Disposition: A | Discharge status: Home / Self Care | Payer: 59 | Source: Ambulatory Visit | Attending: Gynecology | Admitting: Gynecology

## 2013-09-05 DIAGNOSIS — O923 Agalactia: Secondary | ICD-10-CM | POA: Insufficient documentation

## 2013-09-05 NOTE — Lactation Note (Signed)
Adult Lactation Consultation Outpatient Visit Note: Mom here today for assist with breast feeding. Baby has been getting bottles of formula and a little EBM for about 6-7 Mcd[place] Reiling due to weight loss after delivery. Mom rented Symphony pump from [place] for the first month but returned it and bought a Medela swing single pump. Reports that she is pumping 3 times/day and obtains about 1 oz each pumping. Has noticed a decrease in amount of milk obtained for the last month.   Patient Name: Whitney Holloway Date of Birth: Jun 23, 1980 Gestational Age at Delivery: Unknown Type of Delivery:   Breastfeeding History: Frequency of Breastfeeding:  Length of Feeding:  Voids:  Stools:   Supplementing / Method: Pumping:  Type of Pump: Medela Swing   Frequency:3 x/day  Volume:  1 oz  Comments:    Consultation Evaluation:  Initial Feeding Assessment: Pre-feed Weight: 12- 4.2  5560g Post-feed Weight: Amount Transferred: Comments: Attempted to latch baby to bare breast and he would not latch. Tried with # 20 NS with formula placed in NS by curved tip syringe. Baby did take a few sucks but then came off fussing. Calmed him and attempted again. Again a few sucks with formula in NS. Then very fussy so bottle fed about 1 oz to calm him. After a few minutes, he still would not latch and was very fussy. So bottle fed the rest of the feeding. Mom's breasts feel soft. Discussed promoting increased milk supply. Mom decided to rent pump from [place] again for another month and see if milk supply increased. Discussed milk flow from a bottle vs from the breast. Mom has already started taking Fenugreek for about a week.and feels supply may have increased slightly. Encouraged to put baby to the breast when he isn't real hungry- or after feedings do STS. No further questions at present. To call prn     Total Breast milk Transferred this Visit:  Total Supplement Given:   Additional Interventions:   Follow-Up  To call if wants  to make another appointment.     Lemar Livings Paxon Propes 09/05/2013, 1:52 PM

## 2014-03-25 ENCOUNTER — Encounter (HOSPITAL_COMMUNITY): Payer: Self-pay | Admitting: Obstetrics and Gynecology

## 2015-02-27 IMAGING — US US OB DETAIL+14 WK
1 series · 12 of 28 positions shown · non-contrast
Comparison: none

[Series 1: us ob detail+14 wk · 12 of 49 slices shown]
[im 2/49]
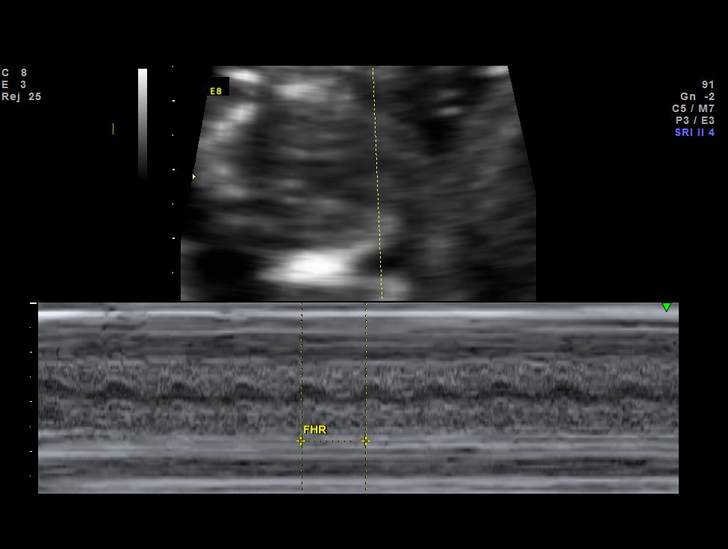
[im 6/49]
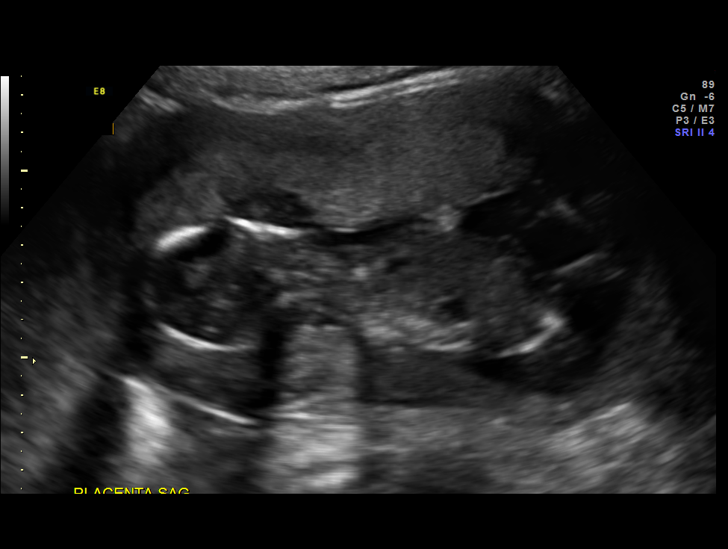
[im 9/49]
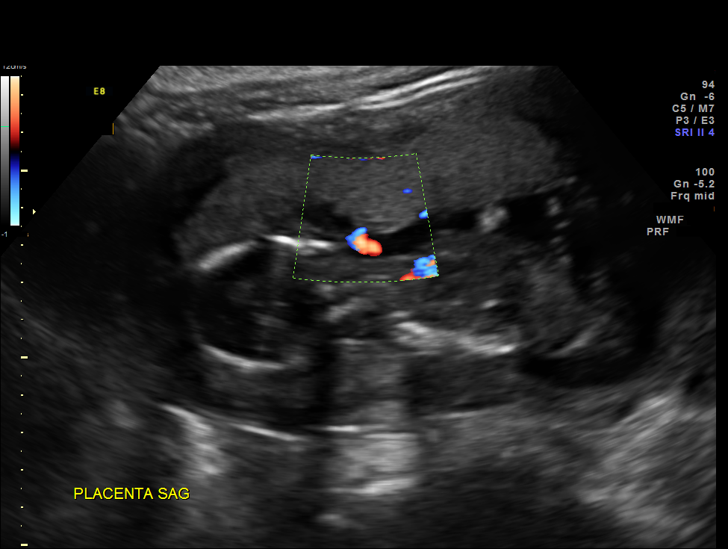
[im 15/49]
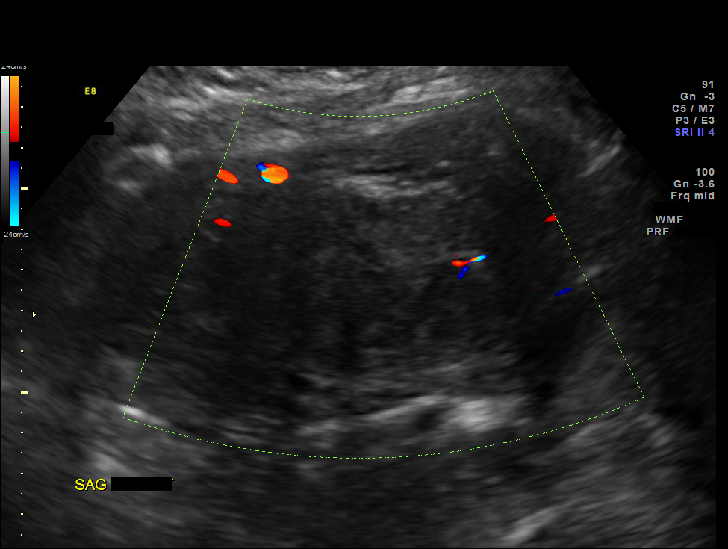
[im 18/49]
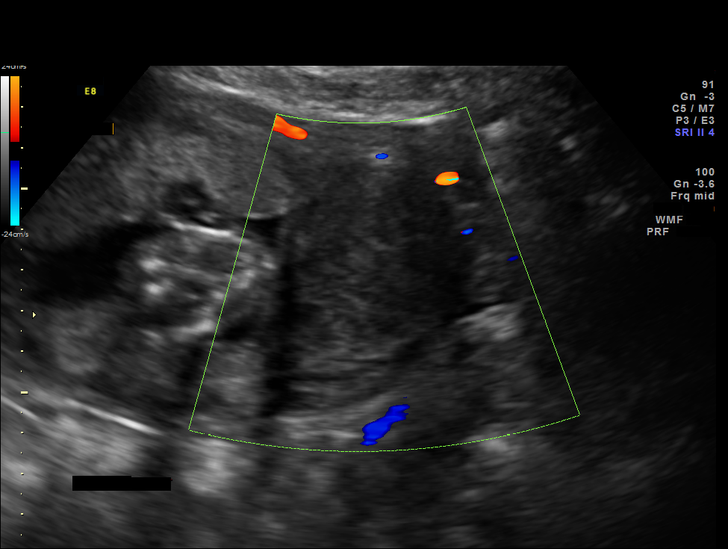
[im 22/49]
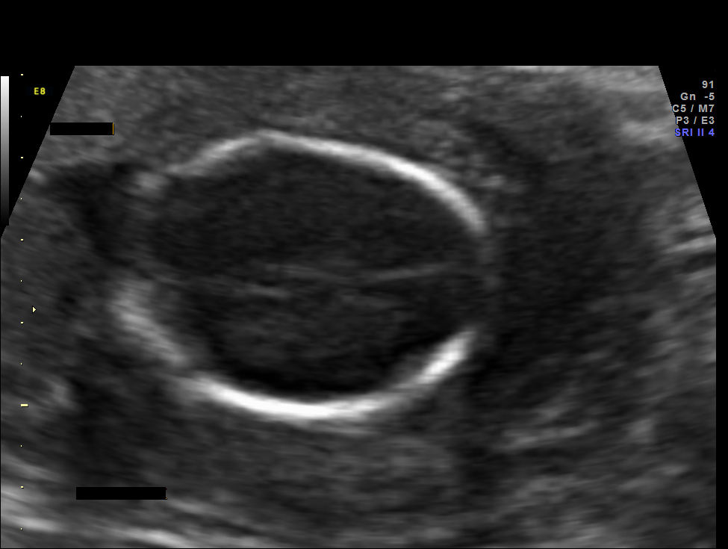
[im 27/49]
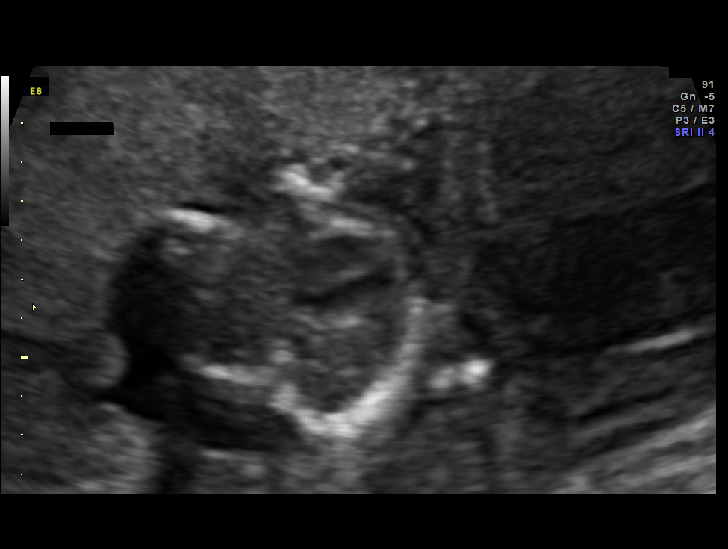
[im 31/49]
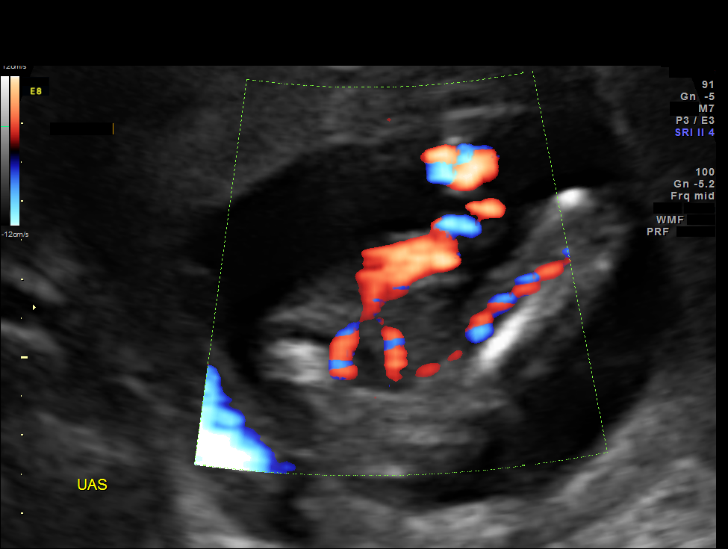
[im 34/49]
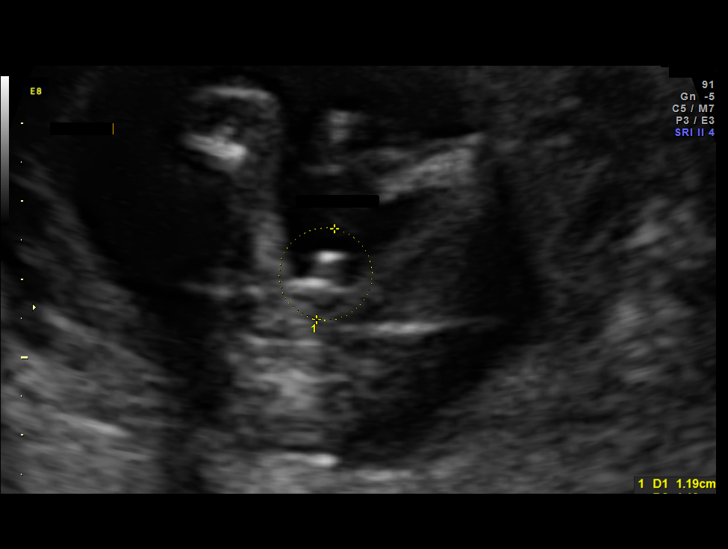
[im 40/49]
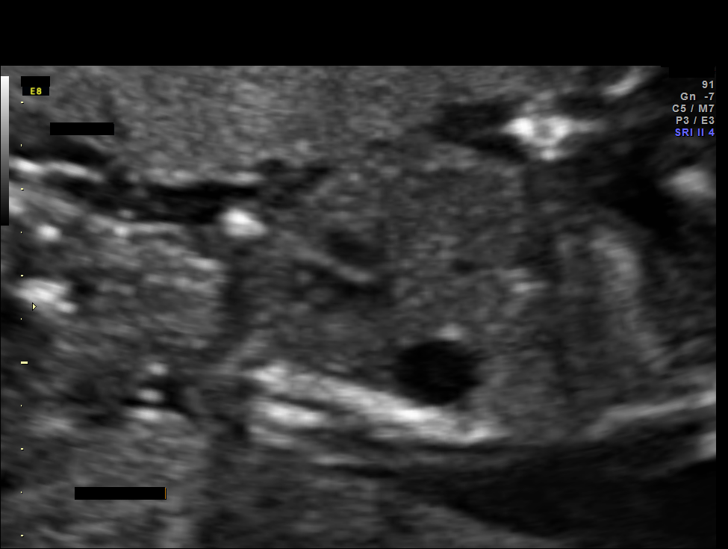
[im 43/49]
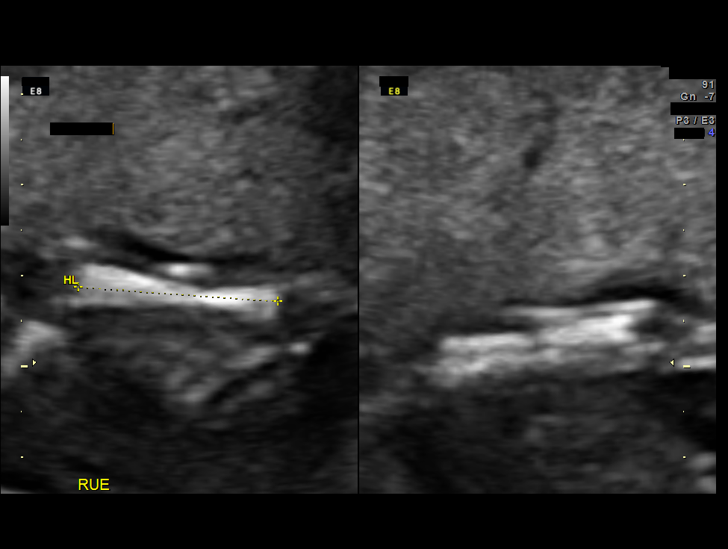
[im 47/49]
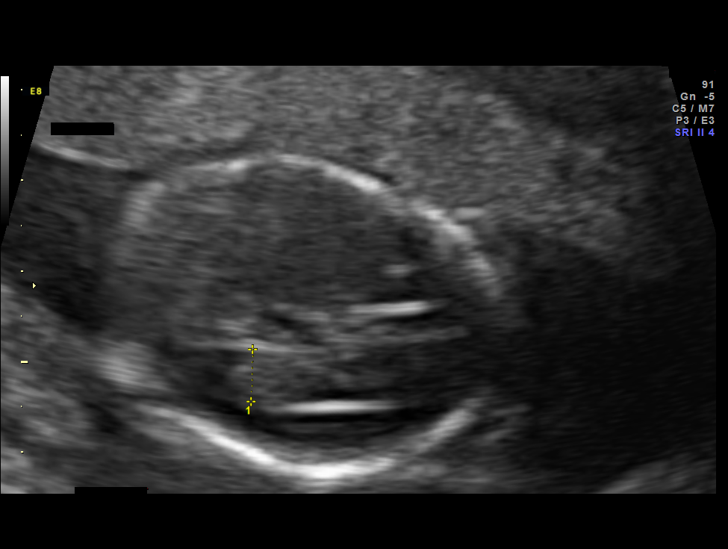

[12 of 28 positions shown; findings below may reference images not displayed]

OBSTETRICS REPORT
                      (Signed Final 01/16/2013 [DATE])

Service(s) Provided

 US OB DETAIL + 14 WK                                  76811.0
Indications

 Detailed fetal anatomic survey
 Diabetes - Pregestational
 Hypothyroid
Fetal Evaluation

 Num Of Fetuses:    1
 Fetal Heart Rate:  155                          bpm
 Cardiac Activity:  Observed
 Presentation:      Breech
 Placenta:          Anterior, above cervical os
 P. Cord            Visualized, central
 Insertion:

 Amniotic Fluid
 AFI FV:      Subjectively within normal limits
                                             Larg Pckt:     4.2  cm
Biometry

 BPD:     34.3  mm     G. Age:  16w 4d                CI:         74.6   70 - 86
 OFD:       46  mm                                    FL/HC:      17.3   14.6 -

 HC:       129  mm     G. Age:  16w 4d       32  %    HC/AC:      1.21   1.07 -

 AC:     106.6  mm     G. Age:  16w 4d       46  %    FL/BPD:
 FL:      22.3  mm     G. Age:  16w 5d       44  %    FL/AC:      20.9   20 - 24
 HUM:     22.9  mm     G. Age:  17w 0d       64  %

 Est. FW:     163  gm      0 lb 6 oz     57  %
Gestational Age

 LMP:           20w 4d        Date:  08/25/12                 EDD:   06/01/13
 U/S Today:     16w 4d                                        EDD:   06/29/13
 Best:          16w 5d     Det. By:  Early Ultrasound         EDD:   06/28/13
                                     (12/27/12)
Anatomy
 Cranium:          Appears normal         Aortic Arch:      Not well visualized
 Fetal Cavum:      Not well visualized    Ductal Arch:      Not well visualized
 Ventricles:       Not well visualized    Diaphragm:        Appears normal
 Choroid Plexus:   Appears normal         Stomach:          Appears normal
 Cerebellum:       Not well visualized    Abdomen:          Appears normal
 Posterior Fossa:  Not well visualized    Abdominal Wall:   Not well visualized
 Nuchal Fold:      Not well visualized    Cord Vessels:     Appears normal (3
                                                            vessel cord)
 Face:             Profile appears        Kidneys:          Appear normal
                   normal
 Lips:             Not well visualized    Bladder:          Appears normal
 Heart:            Not well visualized    Spine:            Not well visualized
 RVOT:             Not well visualized    Lower             Visualized
                                          Extremities:
 LVOT:             Not well visualized    Upper             Visualized
                                          Extremities:

 Other:  Fetus appears to be a male. Nasal bone visualized. Right 5th digit
         appears normal. Technically difficult due to early GA and maternal
         habitus.
Cervix Uterus Adnexa

 Cervical Length:    3.2      cm

 Cervix:       Normal appearance by transabdominal scan.
 Left Ovary:    Within normal limits.
 Right Ovary:   Within normal limits.

 Adnexa:     No abnormality visualized.
Myomas

 Site                     L(cm)      W(cm)       D(cm)      Location
 Left                     6

 Blood Flow                  RI       PI       Comments

Comments

 Ms. Ziflaj fetal anatomic survey is not complete due to
 early gestational age.  However, no gross fetal anomalies
 were identified.  A follow-up ultrasound will be performed in 2
 weeks to reassess fetal growth and anatomy.

 accompanying documentation for further details.
Impression

 Single living intrauterine pregnancy at 16 weeks 5 days.
 Appropriate fetal growth (57%).
 Normal amniotic fluid volume.
 No gross fetal anomalies identified.
Recommendations

 Recommend follow-up ultrasound examination in 2 weeks.
 questions or concerns.
                Jumper, Giorgi

## 2015-03-13 IMAGING — US US OB FOLLOW-UP
1 series · 12 of 28 positions shown · non-contrast
Comparison: none

[Series 1: us ob follow-up · 0.07mm/px · 12 of 78 slices shown]
[im 3/78]
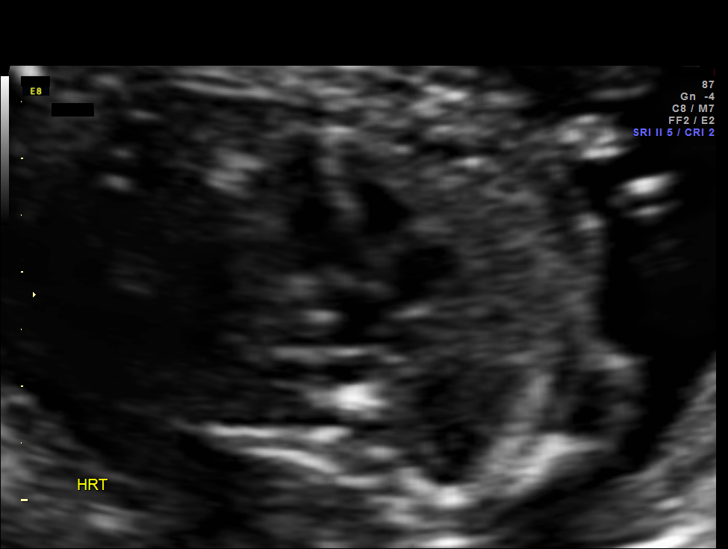
[im 9/78]
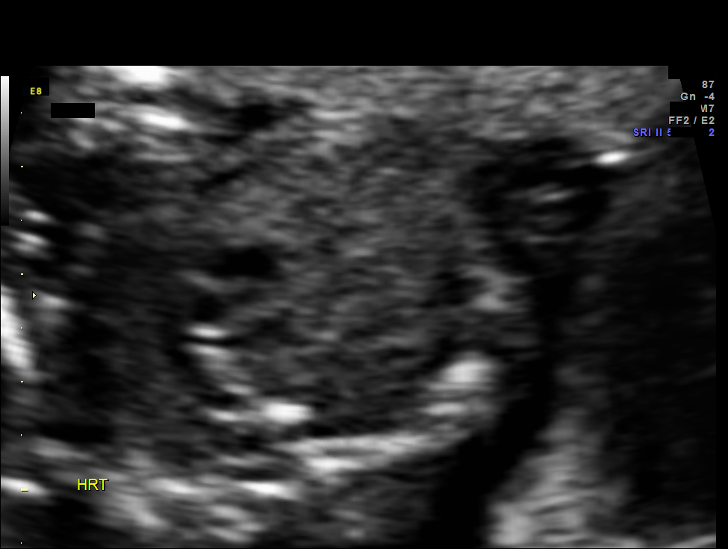
[im 15/78]
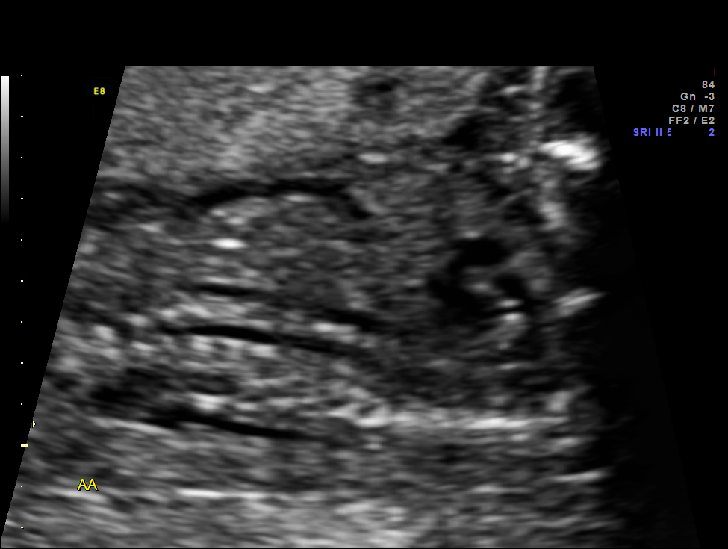
[im 23/78]
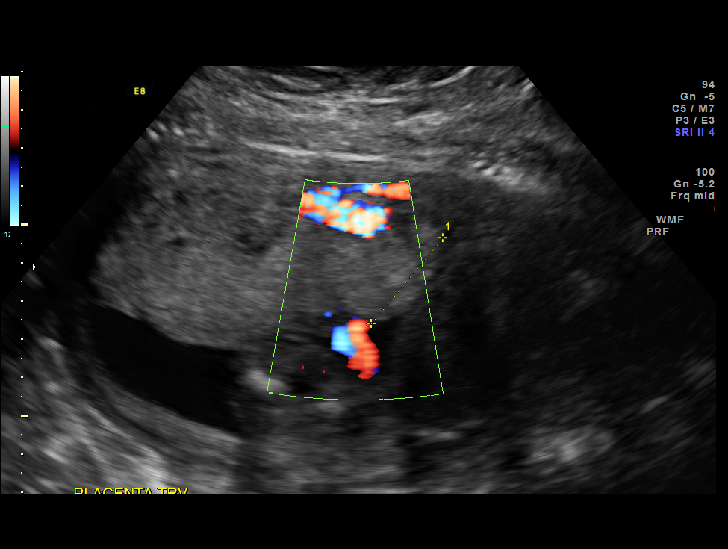
[im 29/78]
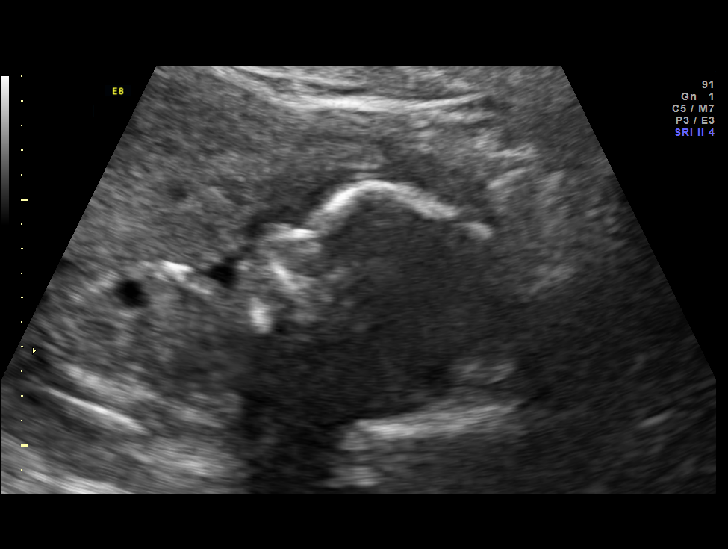
[im 35/78]
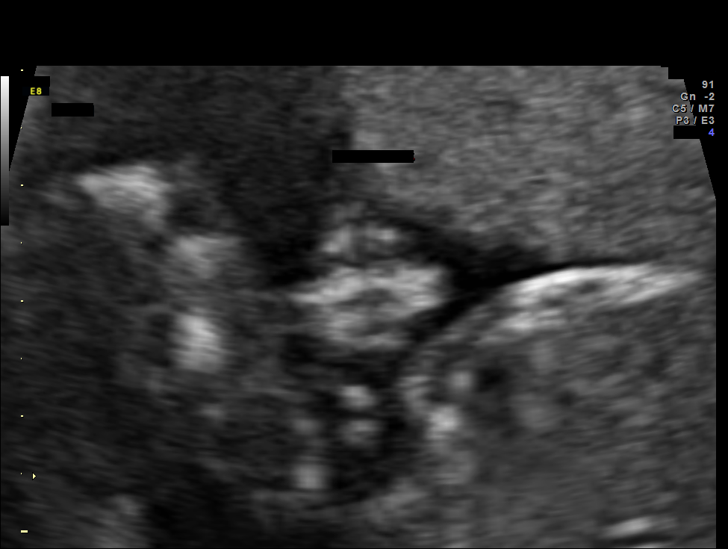
[im 43/78]
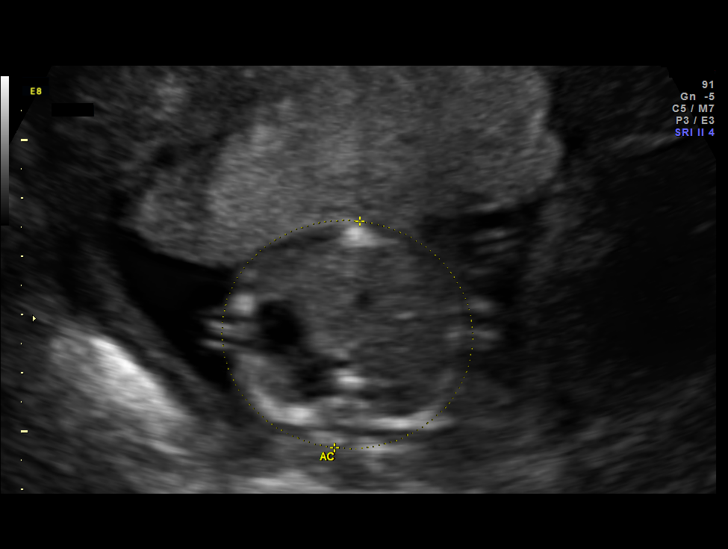
[im 49/78]
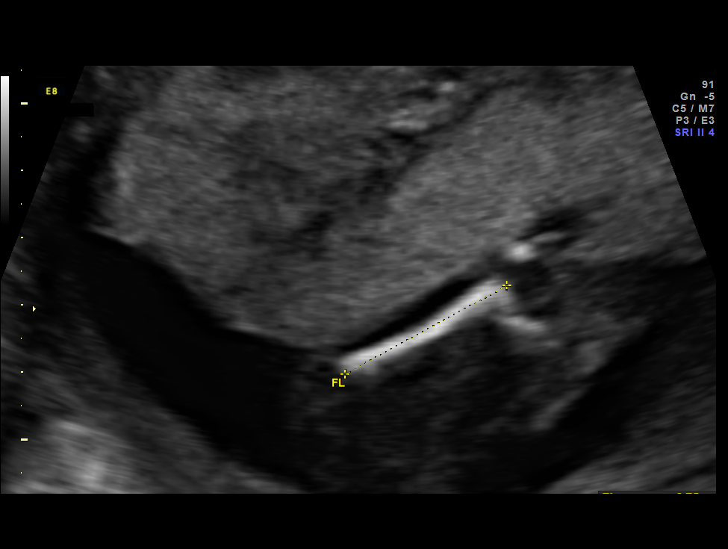
[im 55/78]
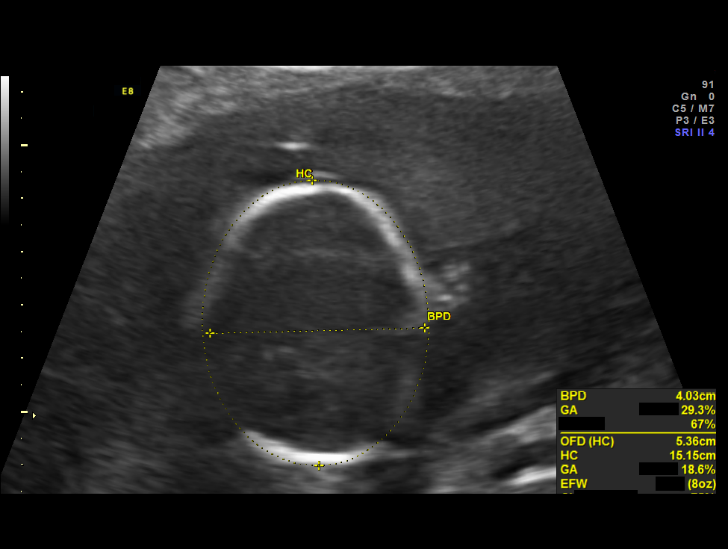
[im 63/78]
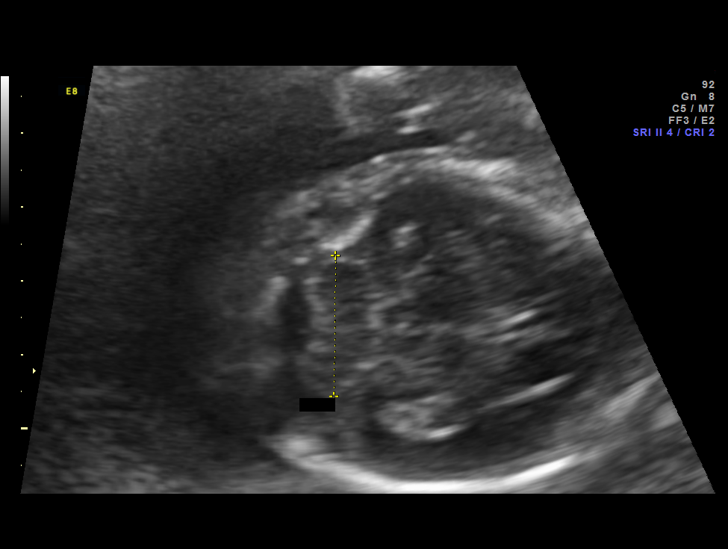
[im 69/78]
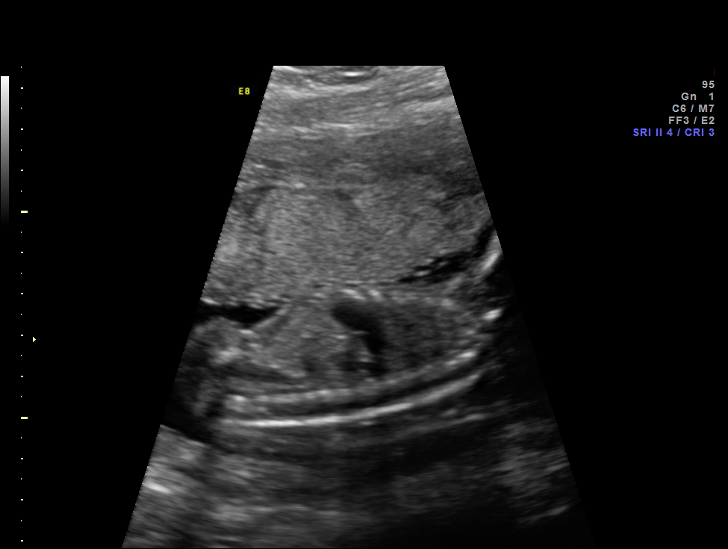
[im 75/78]
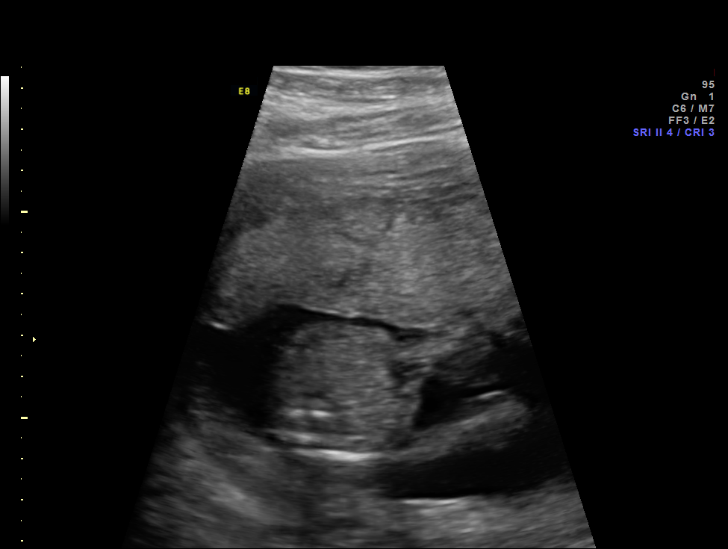

[12 of 28 positions shown; findings below may reference images not displayed]

OBSTETRICS REPORT
                      (Signed Final 01/30/2013 [DATE])

Service(s) Provided

 US OB FOLLOW UP                                       76816.1
Indications

 Follow-up incomplete fetal anatomic evaluation
 Diabetes - Pregestational, type 2 on insulin
 Hypothyroid
 Uterine fibroid
Fetal Evaluation

 Num Of Fetuses:    1
 Fetal Heart Rate:  153                          bpm
 Cardiac Activity:  Observed
 Presentation:      Cephalic
 Placenta:          Anterior, above cervical os
 P. Cord            Visualized, central
 Insertion:

 Amniotic Fluid
 AFI FV:      Subjectively within normal limits
Biometry

 BPD:     41.4  mm     G. Age:  18w 4d                CI:         77.7   70 - 86
 OFD:     53.3  mm                                    FL/HC:      17.8   16.1 -

 HC:     153.6  mm     G. Age:  18w 3d       25  %    HC/AC:      1.18   1.09 -

 AC:     130.5  mm     G. Age:  18w 4d       42  %    FL/BPD:
 FL:      27.3  mm     G. Age:  18w 2d       31  %    FL/AC:      20.9   20 - 24
 HUM:     27.4  mm     G. Age:  18w 5d       53  %
 CER:       19  mm     G. Age:  18w 4d       43  %

 Est. FW:     241  gm      0 lb 9 oz     40  %
Gestational Age

 LMP:           22w 4d        Date:  08/25/12                 EDD:   06/01/13
 U/S Today:     18w 3d                                        EDD:   06/30/13
 Best:          18w 5d     Det. By:  Early Ultrasound         EDD:   06/28/13
                                     (12/27/12)
Anatomy

 Cranium:          Appears normal         Aortic Arch:      Appears normal
 Fetal Cavum:      Appears normal         Ductal Arch:      Appears normal
 Ventricles:       Appears normal         Diaphragm:        Previously seen
 Choroid Plexus:   Appears normal         Stomach:          Appears normal
 Cerebellum:       Appears normal         Abdomen:          Appears normal
 Posterior Fossa:  Appears normal         Abdominal Wall:   Appears nml (cord
                                                            insert, abd wall)
 Nuchal Fold:      Appears normal         Cord Vessels:     Appears normal (3
                                                            vessel cord)
 Face:             Appears normal         Kidneys:          Appear normal
                   (orbits and profile)
 Lips:             Appears normal         Bladder:          Appears normal
 Heart:            Appears normal         Spine:            Not well visualized
                   (4CH, axis, and
                   situs)
 RVOT:             Appears normal         Lower             Previously seen
                                          Extremities:
 LVOT:             Appears normal         Upper             Previously seen
                                          Extremities:

 Other:  Male gender previously seen. Nasal bone visualized. Heels
         visualized. Right 5th digit previously visualized. Technically difficult
         due to maternal habitus and fetal position.
Cervix Uterus Adnexa

 Cervical Length:    3        cm

 Cervix:       Normal appearance by transabdominal scan.
 Left Ovary:    Within normal limits.
 Right Ovary:   Within normal limits.

 Adnexa:     No abnormality visualized.
Myomas

 Site                     L(cm)      W(cm)       D(cm)      Location
 Left                     6

 Blood Flow                  RI       PI       Comments

Impression

 IUP at 18+5 weeks
 Normal interval anatomy; anatomic survey complete except
 for spine views
 Normal amniotic fluid volume
 Measurements c/w prior US
 Fibroid uterus: see above for size and location
Recommendations

 Follow-up ultrasound in 4 weeks to complete anatomy survey
 and assess growth
 Fetal ECHO scheduled
 questions or concerns.
